# Patient Record
Sex: Male | Born: 1985 | Race: White | Hispanic: No | Marital: Single
Health system: Southern US, Community
[De-identification: ages and names within clinical notes are randomized; demographics above are authoritative.]

---

## 2007-03-22 ENCOUNTER — Encounter: Admission: RE | Admit: 2007-03-22 | Discharge: 2007-03-22 | Payer: Self-pay | Admitting: Gastroenterology

## 2007-04-04 ENCOUNTER — Inpatient Hospital Stay (HOSPITAL_COMMUNITY): Admission: EM | Admit: 2007-04-04 | Discharge: 2007-04-08 | Payer: Self-pay | Admitting: Emergency Medicine

## 2007-08-02 ENCOUNTER — Inpatient Hospital Stay (HOSPITAL_COMMUNITY): Admission: EM | Admit: 2007-08-02 | Discharge: 2007-08-04 | Payer: Self-pay | Admitting: Emergency Medicine

## 2007-08-28 ENCOUNTER — Encounter: Admission: RE | Admit: 2007-08-28 | Discharge: 2007-08-28 | Payer: Self-pay | Admitting: Gastroenterology

## 2009-01-14 ENCOUNTER — Encounter: Admission: RE | Admit: 2009-01-14 | Discharge: 2009-01-14 | Payer: Self-pay | Admitting: Gastroenterology

## 2009-11-17 ENCOUNTER — Encounter (HOSPITAL_COMMUNITY): Admission: RE | Admit: 2009-11-17 | Discharge: 2010-02-15 | Payer: Self-pay | Admitting: Gastroenterology

## 2010-02-23 ENCOUNTER — Encounter (HOSPITAL_COMMUNITY)
Admission: RE | Admit: 2010-02-23 | Discharge: 2010-05-11 | Payer: Self-pay | Source: Home / Self Care | Attending: Gastroenterology | Admitting: Gastroenterology

## 2010-06-09 ENCOUNTER — Encounter (HOSPITAL_COMMUNITY): Payer: BC Managed Care – PPO | Attending: Gastroenterology

## 2010-06-09 DIAGNOSIS — K5090 Crohn's disease, unspecified, without complications: Secondary | ICD-10-CM | POA: Insufficient documentation

## 2010-06-22 LAB — CBC
Platelets: 432 10*3/uL — ABNORMAL HIGH (ref 150–400)
RDW: 13.8 % (ref 11.5–15.5)
WBC: 10.1 10*3/uL (ref 4.0–10.5)

## 2010-06-22 LAB — COMPREHENSIVE METABOLIC PANEL
ALT: 13 U/L (ref 0–53)
Albumin: 2.8 g/dL — ABNORMAL LOW (ref 3.5–5.2)
CO2: 26 mEq/L (ref 19–32)
Calcium: 8.6 mg/dL (ref 8.4–10.5)
Chloride: 104 mEq/L (ref 96–112)
Potassium: 3.3 mEq/L — ABNORMAL LOW (ref 3.5–5.1)
Sodium: 138 mEq/L (ref 135–145)
Total Protein: 7 g/dL (ref 6.0–8.3)

## 2010-07-22 ENCOUNTER — Encounter (HOSPITAL_COMMUNITY): Payer: BLUE CROSS/BLUE SHIELD | Attending: Gastroenterology

## 2010-07-22 DIAGNOSIS — K5090 Crohn's disease, unspecified, without complications: Secondary | ICD-10-CM | POA: Insufficient documentation

## 2010-08-04 ENCOUNTER — Encounter (HOSPITAL_COMMUNITY): Payer: BC Managed Care – PPO

## 2010-08-24 NOTE — H&P (Signed)
NAME:  Brian Goodman, Brian Goodman NO.:  [id number]   MEDICAL RECORD NO.:  [id number]          PATIENT TYPE:  INP   LOCATION:  5125                         FACILITY:  [facility]   PHYSICIAN:  Graylin Shiver, M.D.   DATE OF BIRTH:  [birth year]   DATE OF ADMISSION:  04/04/2007  DATE OF DISCHARGE:                              HISTORY & PHYSICAL   HISTORY OF PRESENT ILLNESS:  This is a very pleasant 25 year old male  with a history of severe Crohn's.  He has had worsening abdominal pain  for several weeks and has been seen in our office recently.  As a matter-  of-fact, he had a colonoscopy on 03/26/2007.  He was started on 40 mg of  prednisone one week ago but has no improvement in his symptoms.  He  tells me that he has had watery diarrhea bowel movements every 30 to 60  minutes for the past 24 to 48 hours.  He has increased weakness and  fatigue and severe abdominal cramps.  He has seen no frank blood and is  unable to eat as it causes increase in his GI symptoms.   PAST MEDICAL HISTORY:  Significant for Crohn's on the colonoscopy done  by Dr. Wandalee Ferdinand last week.  He had predominantly right-sided colonic  Crohn's and also more disease scattered throughout the colon and in the  TI.  The patient has small bowel Crohn's disease as well.  He has no  other pertinent past medical history.  No history of surgeries.  Pathology done on a biopsy taken on 03/26/2007, showed chronic active  colitis consistent with Crohn's.   CURRENT MEDICATIONS:  He was started on 03/30/2007 on 40 mg of  prednisone per day.  He also takes daily Feosol.  He has tried Pentasa  in the past, took it for about two weeks and it caused nausea.   ALLERGIES:  He has no known drug allergies.   REVIEW OF SYSTEMS:  Significant for no fever, no shortness of breath, no  palpitations.   SOCIAL HISTORY:  Negative for tobacco or drugs.  The patient is part  owner of [employer].   PHYSICAL EXAMINATION:  GENERAL:  He  is alert and oriented and in no  apparent distress.  VITAL SIGNS:  His temperature is 97.2, pulse 95, respirations are 18,  blood pressure is 148/95.  CARDIOVASCULAR:  Regular rate and rhythm.  LUNGS: Clear to auscultation bilaterally.  ABDOMEN:  Soft, tender to palpation mostly in the lower quadrants.  He  has positive bowel sounds.   LABORATORY:  Labs show a sodium of 130, potassium 4.0, chloride 96,  bicarbonate 25, BUN 8, creatinine 0.82, glucose 137.  LFTs were elevated  with an AST of 70, ALT 114, alkaline phosphatase 179, total bilirubin  0.7.  Hemoglobin is 11.8, hematocrit 35.5, white count 13.7 and  platelets 704,000.  His serum albumin is currently 2.7.   ASSESSMENT:  Dr. Wandalee Ferdinand has seen and examined the patient, collected  a history and his impression is that this is a 25 year old male with a  Crohn's exacerbation as well as protein-calorie malnutrition and  anemia.  We will admit for close monitoring as well as gentle rehydration, start  him on IV steroids and pain control.      Stephani Police, PA    ______________________________  Graylin Shiver, M.D.    MLY/MEDQ  D:  04/04/2007  T:  04/04/2007  Job:  [id number]   cc:   Graylin Shiver, M.D.

## 2010-08-24 NOTE — Consult Note (Signed)
Brian Goodman, KRAS          ACCOUNT NO.:  [id number]   MEDICAL RECORD NO.:  [id number]           PATIENT TYPE:   LOCATION:                                 FACILITY:   PHYSICIAN:  Sharlet Salina T. Hoxworth, M.D.DATE OF BIRTH:  [birth year]   DATE OF CONSULTATION:  08/02/2007  DATE OF DISCHARGE:                                 CONSULTATION   CHIEF COMPLAINT:  Abdominal pain.   HISTORY OF PRESENT ILLNESS:  I was asked by Dr. Charlott Rakes to  evaluate Mr. Cangelosi.  He is a 25 year old Caucasian male with a  history of right-sided Crohn colitis.  His original diagnosis dates back  to about age 25.  He has not received any previous surgical therapy.  He  is generally followed by Dr. Evette Cristal.  The patient essentially has had one  severe flareup since diagnosis which occurred late last year.  He was  hospitalized in December 2008 for this flareup and treated with IV Solu-  Medrol with resolution.  He has been on a steroid taper in recent weeks  and has been feeling well without abdominal pain, diarrhea, or other GI  complaints.  Today, about 6 to 8 hours ago, he had fairly rapid onset of  right lower quadrant pain.  This has been severe and constant pain  localized only in the right lower quadrant.  He has not had any nausea,  vomiting, diarrhea, fever, or chills.  He presented to the emergency  room for evaluation.  The pain currently is better after pain  medication.  Of note, he had a colonoscopy on March 26, 2007, showing  a right-sided colonic Crohn, and he also has evidence of some terminal  ileal involvement.   PAST MEDICAL HISTORY:  Remarkable only as above.  He also had nasal  surgery as a child.   MEDICATIONS:  1. Prednisone 20 mg a day.  2. Imuran 75 mg a day.   ALLERGIES:  None.   SOCIAL HISTORY:  No cigarettes.  He drinks more than 2 drinks a night,  currently employed, single.   FAMILY HISTORY:  Noncontributory.   REVIEW OF SYSTEMS:  GENERAL:  No weight  change, fever, chills, or  malaise.  HEENT:  No vision, hearing, or swallowing problems.  RESPIRATORY:  No shortness breath, cough, or wheezing.  CARDIAC:  No  chest pain or palpitations.  ABDOMEN:  GI as above.  MUSCULOSKELETAL:  No joint pain or swelling.   PHYSICAL EXAMINATION:  VITAL SIGNS:  Temperature is 98, heart rate 96,  respirations 18, and blood pressure 144/90.  GENERAL:  A well-developed white male in no acute stress.  SKIN:  Warm and dry.  No rash or infection.  HEENT:  No palpable masses or thyromegaly.  Sclerae nonicteric.  LYMPH NODES:  No cervical, supraclavicular, or inguinal nodes palpable.  LUNGS:  Clear without wheezing or increased work of breathing.  CARDIAC:  Regular and mild tachycardia.  No murmurs.  ABDOMEN:  Nondistended.  There is well-localized right lower quadrant  tenderness with guarding.  No discernible masses or organomegaly.  EXTREMITIES:  No joint swelling or deformity.  NEUROLOGIC:  Alert and oriented.   LABORATORY DATA:  White count 18,000, hemoglobin 14, hematocrit 40.2,  and platelets 327.  Electrolytes are abnormal only for potassium of 3.  LFTs and lipase were normal.   CT scan of the abdomen and pelvis was personally reviewed.  This shows  pericecal inflammation most consistent with Crohn disease.  I cannot  rule out cecal diverticulitis or microperforation.  Also, I cannot  completely rule out appendicitis, although the appendix is visualized;  it is only mildly enlarged and there is not any specific periappendiceal  inflammation.   ASSESSMENT/PLAN:  Acute right lower quadrant abdominal pain.  This  patient with known right colonic and terminal ileal Crohn's disease.  He  had a flareup in December but had been doing well recently until this  very acute illness.  Clinically, I cannot completely rule out  appendicitis, but particularly based on the CT scan, this seems less  likely than a Crohn flareup or microperforation secondary to  Crohn.   The patient is being admitted by the GI service.  I agree with steroids  and antibiotics.  We will follow closely.  If he worsens or fails to  improve, we will require surgery which would require a partial colectomy  regardless of the etiology.      Lorne Skeens. Hoxworth, M.D.  Electronically Signed     BTH/MEDQ  D:  08/02/2007  T:  08/02/2007  Job:  [id number]

## 2010-08-24 NOTE — H&P (Signed)
NAMEMARVEN, VELEY          ACCOUNT NO.:  [id number]   MEDICAL RECORD NO.:  [id number]          PATIENT TYPE:  INP   LOCATION:  5118                         FACILITY:  [facility]   PHYSICIAN:  Shirley Friar, MDDATE OF BIRTH:  [birth year]   DATE OF ADMISSION:  08/01/2007  DATE OF DISCHARGE:                              HISTORY & PHYSICAL   ADMISSION DIAGNOSES:  1. Abdominal pain.  2. Crohn disease.   HISTORY OF PRESENT ILLNESS:  Mr. Bailly is a 25 year old white male  with Crohn disease diagnosed as a child who has ileocolonic disease.  He  had a recent hospitalization in December 2008 for Crohn's flare.  During  that hospitalization, he had an upper GI series and small bowel follow-  through, which showed irregular mucosa in the terminal ilium, but no  stricture noted.  He is on prednisone 20 mg p.o. daily and Imuran 75 mg  p.o. daily for his Crohn disease.  He was in his usual state of health  until 2 p.m. on yesterday afternoon (08/01/2007) when he had acute onset  of right lower quadrant sharp pain that quickly became 9/10 in severity.  The pain persisted for 8 hours when he called the on call doctor and he  was told to go to emergency room.  He denied any associated, nausea,  vomiting, fevers, chills, although he did have some diaphoresis at times  when he was having the pain.  He reports that during previous Crohn's  flares he would have associated diarrhea with pain, but during this  episode, he denied any change in his bowel habits.  On presentation to  the emergency room, he was afebrile.  He had a white blood count of  62952.  His white blood count in December was 15,000.  A noncontrast CT  scan, which showed pericecal inflammation and ileocecal mesentery.  The  appendix was enlarged but normal in appearance, and it was thought to be  separate location from the inflammatory process on CT.   PAST MEDICAL HISTORY:  Crohn disease, alcohol abuse.   MEDICINES:  1.  Prednisone 20 mg p.o. daily.  2. Imuran 75 mg p.o. daily.   ALLERGIES:  No known drug allergies.   FAMILY HISTORY:  Noncontributory.   SOCIAL HISTORY:  Alcohol abuse (drinks 2 glasses of rum per day and  occasional beer), marijuana use, denies smoking.   REVIEW OF SYSTEMS:  Negative except as stated above.   PHYSICAL EXAMINATION:  VITAL SIGNS:  Temperature 98.1, pulse 96, blood  pressure 144/90, and O2 sat is 99% on room air.  GENERAL:  Alert and in no acute distress.  CARDIOVASCULAR:  Tachycardic, regular rhythm.  CHEST:  Clear to auscultation bilaterally.  ABDOMEN:  Tender in the right lower quadrant to light palpation with  guarding, mild tenderness in the left lower quadrant without guarding,  otherwise nontender, no rebound, positive bowel sounds, and soft.   LABORATORY DATA:  White blood count 18,000, hemoglobin 14, platelet  count 327, potassium 3, glucose 128, and lipase 26.   IMPRESSION:  A 25 year old white male with known Crohn ileocolitis  presenting with severe right  lower quadrant abdominal pain.  After  reviewing his CT scan, I think his pain is probably related to  inflammation of his distal small bowel and a possible contained  perforation versus intense inflammation of the terminal ileum.  I do not  think he has appendicitis clinically.  His white blood count is likely  elevated due to his Crohn's and steroid treatment.  We will admit the  patient to the hospital for IV fluids, steroids, and empiric  antibiotics.  We will get a surgical consult.  I will place the patient  n.p.o. for now.  The patient may need to have a small bowel follow-  through during this hospitalization to further evaluate his terminal  ileal disease.      Shirley Friar, MD  Electronically Signed     VCS/MEDQ  D:  08/02/2007  T:  08/02/2007  Job:  [id number]   cc:   Graylin Shiver, M.D.  Lorne Skeens. Hoxworth, M.D.

## 2010-08-27 NOTE — Discharge Summary (Signed)
NAME:  Brian Goodman, Brian Goodman          ACCOUNT NO.:  [id number]   MEDICAL RECORD NO.:  [id number]          PATIENT TYPE:  INP   LOCATION:  5149                         FACILITY:  [facility]   PHYSICIAN:  James L. Randa Evens, M.D. DATE OF BIRTH:  1985/10/14   DATE OF ADMISSION:  08/02/2007  DATE OF DISCHARGE:  08/04/2007                               DISCHARGE SUMMARY   ADMITTING DIAGNOSES:  1. Crohn's disease.  2. Increased abdominal pain.   DISCHARGE DIAGNOSIS:  Crohn's disease.   CONSULT:  [facility] Dr. Glenna Fellows was consulted  in order to evaluate the patient for any perforation or possible  appendicitis.  No immediate surgical date was found but we appreciate  [facility] following along with [place] to care for the  patient.   PROCEDURES:  None.   RADIOLOGICAL EXAMINATION:  CT of his abdomen and pelvis on April 22  showed pericecal inflammation in the ileocecal mesentery.   HISTORY OF PRESENT ILLNESS:  This is a 25 year old male who has had  ileocolonic Crohn's disease since he was approximately 25 years old.  He  had a recent hospitalization December 2008 for another Crohn's flare.  He reported a sharp onset of right lower quadrant pain that started on  the afternoon of April 22, however, he had no change in bowel habits as  he normally does with other Crohn's flares.  He also denied any fever or  vomiting.  He was admitted to [facility] with a white count of  18,000, hemoglobin 14, hematocrit 40, platelet count 327.  Hepatitis B  surface antigen was drawn.  It was negative.  PPD was tested it was  negative.   HOSPITAL COURSE:  The patient was admitted, started on IV steroids.  He  very quickly improved.  He was seen by [facility].  He  also received Cipro and Flagyl.  His diet was advanced the day after  admission.  The following day his medications were changed to oral.  His  pain had substantially decreased, and he was  feeling much improved.  He  was able to tolerate a full regular diet.  He was discharged to home in  good condition on August 04, 2007, by Dr. Vida Rigger.  They have  instructed the patient not to eat any fried or spicy foods, uncooked  vegetables, popcorns, or nuts.  He also told him to follow up with Dr.  Evette Cristal in 10 days for an office visit.   DISCHARGE MEDICATIONS:  Included prednisone 30 mg two times a day for 1  week, if doing well he can decrease prednisone to 20 mg two times a day  until he sees Dr. Evette Cristal, Imuran 25 mg in the morning 50 mg in the  evening, Cipro 500 mg two times a day for 1 week, Flagyl 500 mg two  times a day for 1 week, Tylenol over the counter, no aspirin or  arthritis medications.  He was also given Darvocet for pain if not  relieved by Tylenol and was instructed not to drink any alcohol with  Flagyl.      Clerance Lav  L [place], PA    ______________________________  Llana Aliment Randa Evens, M.D.    MLY/MEDQ  D:  08/07/2007  T:  08/08/2007  Job:  [id number]

## 2010-08-27 NOTE — Discharge Summary (Signed)
Brian Goodman, Brian Goodman           ACCOUNT NO.:  [id number]   MEDICAL RECORD NO.:  [id number]           PATIENT TYPE:   LOCATION:                                 FACILITY:   PHYSICIAN:  Graylin Shiver, M.D.   DATE OF BIRTH:  1985-07-29   DATE OF ADMISSION:  04/04/2007  DATE OF DISCHARGE:  04/08/2007                               DISCHARGE SUMMARY   ADMIT DIAGNOSIS:  Inflammatory bowel disease, Crohn colitis.   DISCHARGE DIAGNOSES:  1. Inflammatory bowel disease.  2. Protein-calorie malnutrition.  3. Acute blood loss anemia.   PROCEDURES:  None.   CONSULTANTS:  None.   BRIEF HISTORY AND PHYSICAL:   HISTORY OF PRESENT ILLNESS:  This is a 25 year old male with a history  of severe Crohn colitis.  He has had worsening abdominal pain for  several weeks.  He was started on prednisone orally approximately 1 week  ago but has had no improvement in his symptoms.  On admission, he was  complaining of watery diarrhea every 30 to 60 minutes over the last 48  hours.  He had increased weakness, fatigue, and abdominal cramps but no  frank blood.  He was admitted, started on IV steroids as well as gentle  hydration.   Admit labs showed a white count of 13.7, hemoglobin 11.8, hematocrit  35.5, and platelets 704.  BMET was within normal limits other than a  glucose of 137.  LFTs showed an AST 70, ALT 114, alk phos 179, total  bilirubin 0.7, and serum albumin was 2.7.   During the course of his hospitalization, he felt better on IV steroids,  it was noted that he has had a colonoscopy the week prior to admission,  which showed predominantly right-sided colonic Crohn disease with more  scattered disease throughout the colon.  On March 21, 2008, prior to  this admission, the patient has had an upper GI series with small bowel  follow through that showed irregularity of the mucosa of the terminal  ileum indicating that Crohn disease may be in the TI as well.  Over the  course of this  hospitalization, we were able to slowly advance his diet,  so he was able to tolerate a full diet.  His stools decreased in number  and became more solid.  We were able to move  him from IV steroids to p.o. prednisone.  The patient was discharged  home in stable condition on medications that included prednisone 40 mg  daily and ferrous sulfate 325 mg daily.  He was told to call our office  with any signs of fever or severe abdominal pain or increase in stooling  again as well as any blood per rectum.      Stephani Police, PA    ______________________________  Graylin Shiver, M.D.    MLY/MEDQ  D:  07/17/2007  T:  07/18/2007  Job:  [id number]   cc:   Graylin Shiver, M.D.

## 2010-09-02 ENCOUNTER — Encounter (HOSPITAL_COMMUNITY): Payer: BC Managed Care – PPO | Attending: Gastroenterology

## 2010-09-02 DIAGNOSIS — K5090 Crohn's disease, unspecified, without complications: Secondary | ICD-10-CM | POA: Insufficient documentation

## 2010-10-14 ENCOUNTER — Encounter (HOSPITAL_COMMUNITY): Payer: BC Managed Care – PPO | Attending: Gastroenterology

## 2010-10-14 DIAGNOSIS — K5090 Crohn's disease, unspecified, without complications: Secondary | ICD-10-CM | POA: Insufficient documentation

## 2010-11-25 ENCOUNTER — Encounter (HOSPITAL_COMMUNITY): Payer: BC Managed Care – PPO | Attending: Gastroenterology

## 2010-11-25 DIAGNOSIS — K5090 Crohn's disease, unspecified, without complications: Secondary | ICD-10-CM | POA: Insufficient documentation

## 2011-01-04 LAB — COMPREHENSIVE METABOLIC PANEL
CO2: 26
Calcium: 8.9
Chloride: 102
GFR calc Af Amer: >60

## 2011-01-04 LAB — CBC
HCT: 38 — ABNORMAL LOW
HCT: 38.2 — ABNORMAL LOW
HCT: 40.2
Hemoglobin: 13
Hemoglobin: 13.3
Hemoglobin: 14
MCHC: 34
MCHC: 34
MCHC: 34.8
MCV: 81.8
MCV: 82.4
MCV: 82.8
Platelets: 287
Platelets: 309
Platelets: 327
RBC: 4.73
RBC: 4.92
RDW: 14.3
RDW: 14.4
RDW: 15
WBC: 16.8 — ABNORMAL HIGH
WBC: 18 — ABNORMAL HIGH
WBC: 19.1 — ABNORMAL HIGH
WBC: 20.9 — ABNORMAL HIGH

## 2011-01-04 LAB — DIFFERENTIAL
Basophils Absolute: 0.1
Basophils Relative: 0
Eosinophils Absolute: 0
Eosinophils Relative: 0
Lymphocytes Relative: 21
Lymphs Abs: 3.8
Monocytes Absolute: 1.2 — ABNORMAL HIGH
Monocytes Relative: 4
Monocytes Relative: 7
Neutro Abs: 12.9 — ABNORMAL HIGH
Neutrophils Relative %: 72

## 2011-01-04 LAB — BASIC METABOLIC PANEL
BUN: 17
CO2: 26
Calcium: 9.2
Calcium: 9.7
Chloride: 103
Creatinine, Ser: 0.82
Creatinine, Ser: 0.96
GFR calc Af Amer: >60
GFR calc non Af Amer: >60
Potassium: 4.1
Potassium: 4.1
Sodium: 138

## 2011-01-04 LAB — COMPREHENSIVE METABOLIC PANEL WITH GFR
ALT: 27
AST: 21
Albumin: 4
Alkaline Phosphatase: 64
BUN: 21
Creatinine, Ser: 1.12
GFR calc non Af Amer: >60
Glucose, Bld: 128 — ABNORMAL HIGH
Potassium: 3 — ABNORMAL LOW
Sodium: 139
Total Bilirubin: 0.4
Total Protein: 6.8

## 2011-01-04 LAB — LIPASE, BLOOD: Lipase: 26

## 2011-01-06 ENCOUNTER — Encounter (HOSPITAL_COMMUNITY): Payer: BC Managed Care – PPO | Attending: Gastroenterology

## 2011-01-06 DIAGNOSIS — K5090 Crohn's disease, unspecified, without complications: Secondary | ICD-10-CM | POA: Insufficient documentation

## 2011-01-14 LAB — COMPREHENSIVE METABOLIC PANEL
ALT: 114 — ABNORMAL HIGH
ALT: 78 — ABNORMAL HIGH
Alkaline Phosphatase: 127 — ABNORMAL HIGH
CO2: 25
CO2: 27
Creatinine, Ser: 0.69
GFR calc Af Amer: >60
GFR calc Af Amer: >60
GFR calc non Af Amer: >60
GFR calc non Af Amer: >60
Glucose, Bld: 106 — ABNORMAL HIGH
Potassium: 4
Potassium: 4
Total Protein: 6.1
Total Protein: 8

## 2011-01-14 LAB — CBC
HCT: 30.3 — ABNORMAL LOW
HCT: 31.4 — ABNORMAL LOW
HCT: 35.5 — ABNORMAL LOW
Hemoglobin: 10 — ABNORMAL LOW
Hemoglobin: 10 — ABNORMAL LOW
Hemoglobin: 11.8 — ABNORMAL LOW
MCHC: 33
MCHC: 33.1
MCV: 67 — ABNORMAL LOW
MCV: 67.7 — ABNORMAL LOW
Platelets: 553 — ABNORMAL HIGH
Platelets: 704 — ABNORMAL HIGH
RBC: 4.47
RBC: 4.56
RBC: 5.31
RDW: 15.2
RDW: 15.8 — ABNORMAL HIGH
RDW: 15.9 — ABNORMAL HIGH
WBC: 13.7 — ABNORMAL HIGH
WBC: 14.9 — ABNORMAL HIGH
WBC: 15.7 — ABNORMAL HIGH

## 2011-01-14 LAB — DIFFERENTIAL
Basophils Relative: 0
Eosinophils Absolute: 0 — ABNORMAL LOW
Lymphocytes Relative: 6 — ABNORMAL LOW
Monocytes Absolute: 0.3
Neutro Abs: 12.6 — ABNORMAL HIGH
Neutrophils Relative %: 92 — ABNORMAL HIGH

## 2011-01-14 LAB — GIARDIA/CRYPTOSPORIDIUM SCREEN(EIA)
Cryptosporidium Screen (EIA): NEGATIVE
Giardia Screen - EIA: NEGATIVE

## 2011-02-09 ENCOUNTER — Other Ambulatory Visit (HOSPITAL_COMMUNITY): Payer: Self-pay | Admitting: *Deleted

## 2011-02-18 ENCOUNTER — Encounter (HOSPITAL_COMMUNITY)
Admission: RE | Admit: 2011-02-18 | Discharge: 2011-02-18 | Disposition: A | Discharge status: Home / Self Care | Payer: BC Managed Care – PPO | Source: Ambulatory Visit | Attending: Gastroenterology | Admitting: Gastroenterology

## 2011-02-18 DIAGNOSIS — K5090 Crohn's disease, unspecified, without complications: Secondary | ICD-10-CM | POA: Insufficient documentation

## 2011-02-18 MED ORDER — SODIUM CHLORIDE 0.9 % IV SOLN
5.0000 mg/kg | Freq: Once | INTRAVENOUS | Status: AC
  Administered 2011-02-18: 300 mg via INTRAVENOUS
  Filled 2011-02-18: qty 30

## 2011-02-18 MED ORDER — SODIUM CHLORIDE 0.9 % IV SOLN: 5.0000 mg/kg | INTRAVENOUS | Status: DC

## 2011-03-24 ENCOUNTER — Other Ambulatory Visit: Payer: Self-pay | Admitting: Gastroenterology

## 2011-03-28 ENCOUNTER — Other Ambulatory Visit: Payer: BC Managed Care – PPO

## 2011-04-01 ENCOUNTER — Encounter (HOSPITAL_COMMUNITY)
Admission: RE | Admit: 2011-04-01 | Discharge: 2011-04-01 | Disposition: A | Discharge status: Home / Self Care | Payer: BC Managed Care – PPO | Source: Ambulatory Visit | Attending: Gastroenterology | Admitting: Gastroenterology

## 2011-04-01 DIAGNOSIS — K5090 Crohn's disease, unspecified, without complications: Secondary | ICD-10-CM | POA: Insufficient documentation

## 2011-04-01 MED ORDER — SODIUM CHLORIDE 0.9 % IV SOLN
5.0000 mg/kg | INTRAVENOUS | Status: DC
  Administered 2011-04-01: 300 mg via INTRAVENOUS
  Filled 2011-04-01: qty 30

## 2011-04-06 ENCOUNTER — Ambulatory Visit
Admission: RE | Admit: 2011-04-06 | Discharge: 2011-04-06 | Disposition: A | Discharge status: Home / Self Care | Payer: BC Managed Care – PPO | Source: Ambulatory Visit | Attending: Gastroenterology | Admitting: Gastroenterology

## 2011-05-10 ENCOUNTER — Other Ambulatory Visit (HOSPITAL_COMMUNITY): Payer: Self-pay | Admitting: *Deleted

## 2011-05-13 ENCOUNTER — Encounter (HOSPITAL_COMMUNITY)
Admission: RE | Admit: 2011-05-13 | Discharge: 2011-05-13 | Disposition: A | Discharge status: Home / Self Care | Payer: BC Managed Care – PPO | Source: Ambulatory Visit | Attending: Gastroenterology | Admitting: Gastroenterology

## 2011-05-13 DIAGNOSIS — K5090 Crohn's disease, unspecified, without complications: Secondary | ICD-10-CM | POA: Insufficient documentation

## 2011-05-13 MED ORDER — SODIUM CHLORIDE 0.9 % IV SOLN
5.0000 mg/kg | INTRAVENOUS | Status: AC
  Administered 2011-05-13: 300 mg via INTRAVENOUS
  Filled 2011-05-13: qty 30

## 2011-06-22 ENCOUNTER — Other Ambulatory Visit (HOSPITAL_COMMUNITY): Payer: Self-pay | Admitting: *Deleted

## 2011-06-24 ENCOUNTER — Encounter (HOSPITAL_COMMUNITY)
Admission: RE | Admit: 2011-06-24 | Discharge: 2011-06-24 | Disposition: A | Discharge status: Home / Self Care | Payer: BC Managed Care – PPO | Source: Ambulatory Visit | Attending: Gastroenterology | Admitting: Gastroenterology

## 2011-06-24 DIAGNOSIS — K5090 Crohn's disease, unspecified, without complications: Secondary | ICD-10-CM | POA: Insufficient documentation

## 2011-06-24 MED ORDER — SODIUM CHLORIDE 0.9 % IV SOLN
Freq: Once | INTRAVENOUS | Status: AC
  Administered 2011-06-24: 09:00:00 via INTRAVENOUS

## 2011-06-24 MED ORDER — SODIUM CHLORIDE 0.9 % IV SOLN
5.0000 mg/kg | INTRAVENOUS | Status: AC
  Administered 2011-06-24: 300 mg via INTRAVENOUS
  Filled 2011-06-24: qty 30

## 2011-07-03 IMAGING — CT CT ABDOMEN W/ CM
2 of 4 series · 10 of 36 positions shown, 17 images · IV contrast (READICAT/WATER & [ID] OMNI 300)
Comparison: 08/01/2007

CT ABDOMEN

CLINICAL DATA: Nausea, abdominal pain, Crohn's disease.

CT ABDOMEN AND PELVIS WITH CONTRAST
TECHNIQUE: Multidetector CT imaging of the abdomen and pelvis was
performed using the standard protocol following bolus
administration of intravenous contrast.
Contrast: 100 ml Rmnipaque-YTT

[Series 3: routine abdomen · axial · 0.65mm/px · z∈[-429,-74]mm · 9 of 89 slices shown, 15 images]
[im 9/89  soft-tissue]
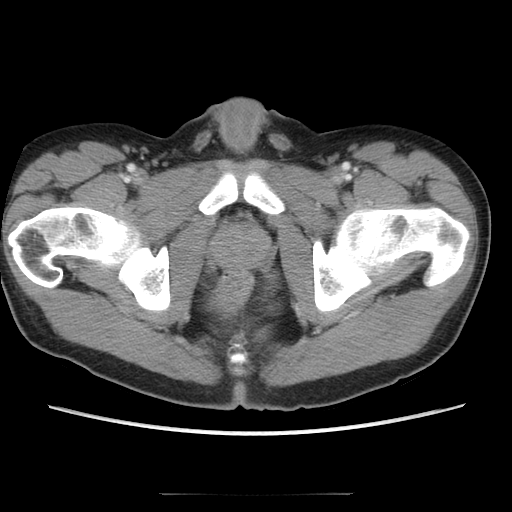
[im 9/89  bone]
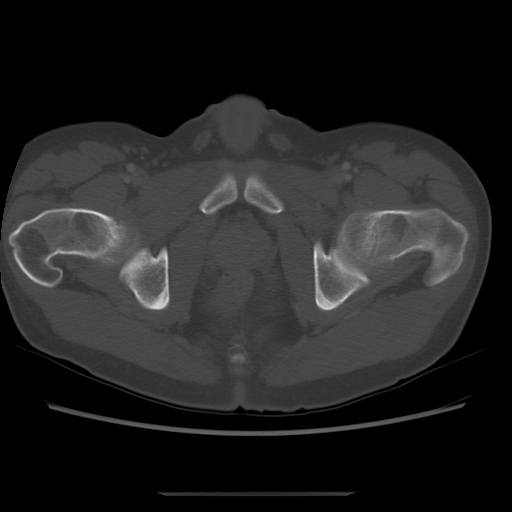
[im 18/89  soft-tissue]
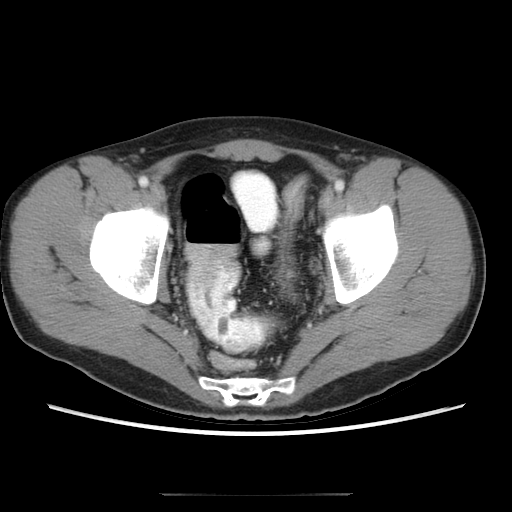
[im 27/89  soft-tissue]
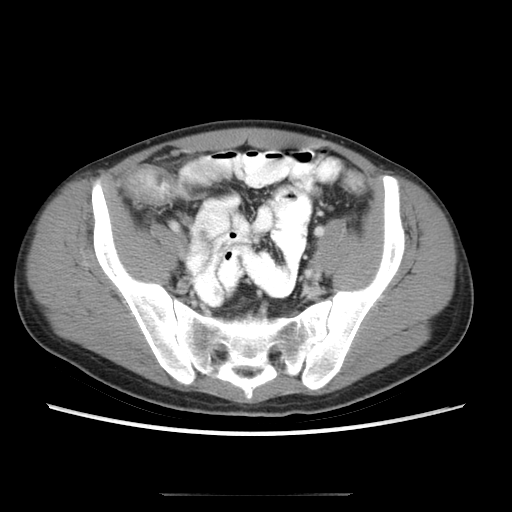
[im 36/89  soft-tissue]
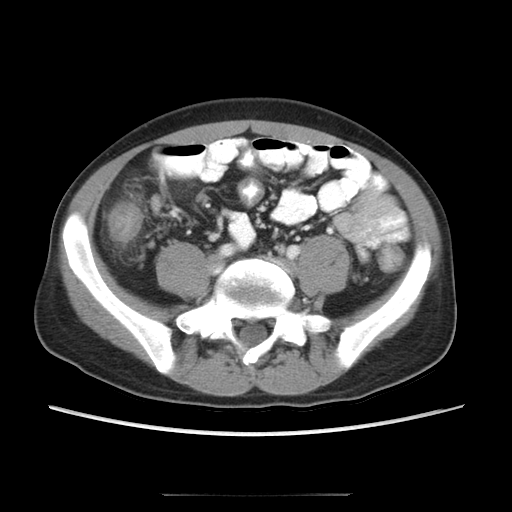
[im 45/89  soft-tissue]
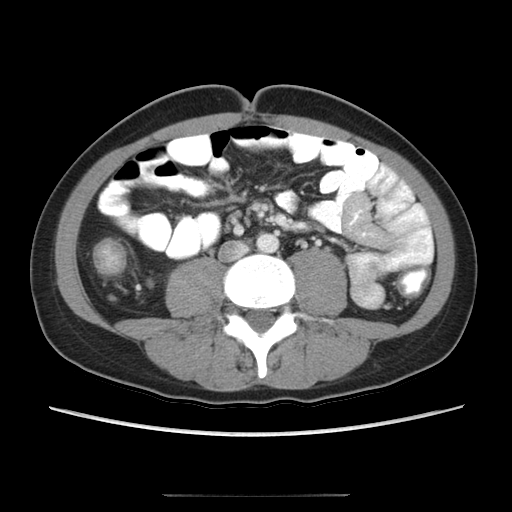
[im 53/89  soft-tissue]
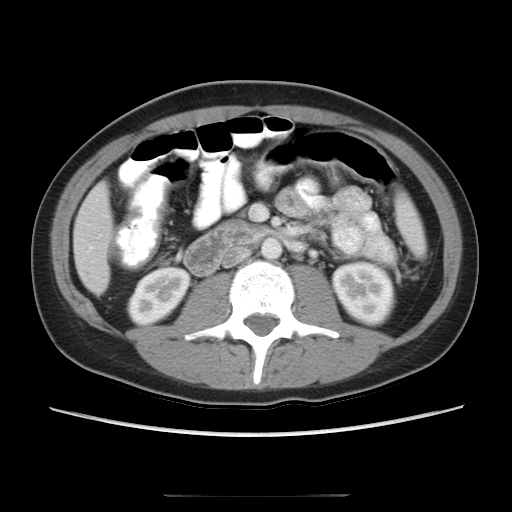
[im 53/89  lung]
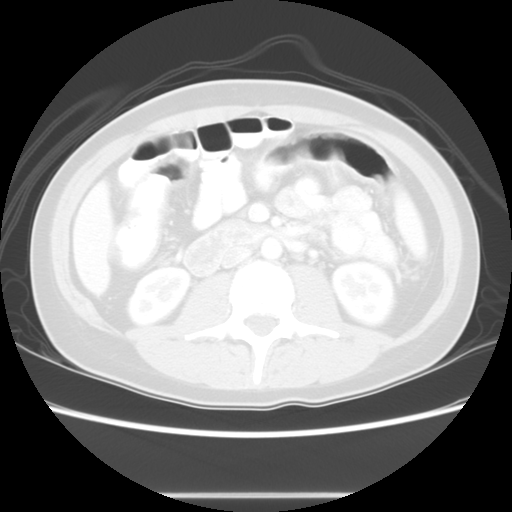
[im 62/89  soft-tissue]
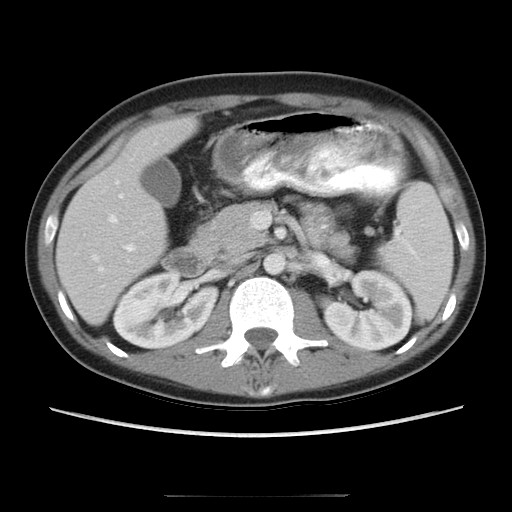
[im 62/89  lung]
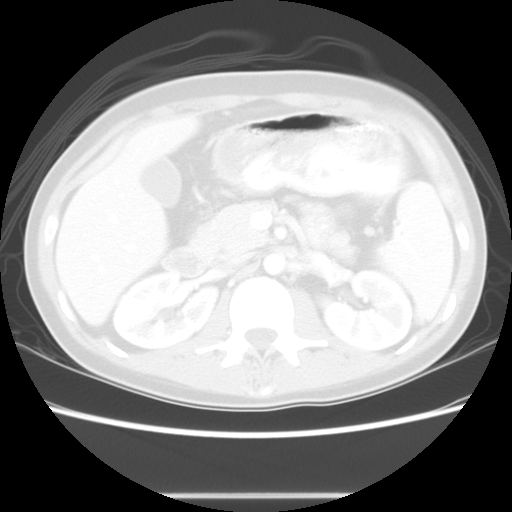
[im 71/89  soft-tissue]
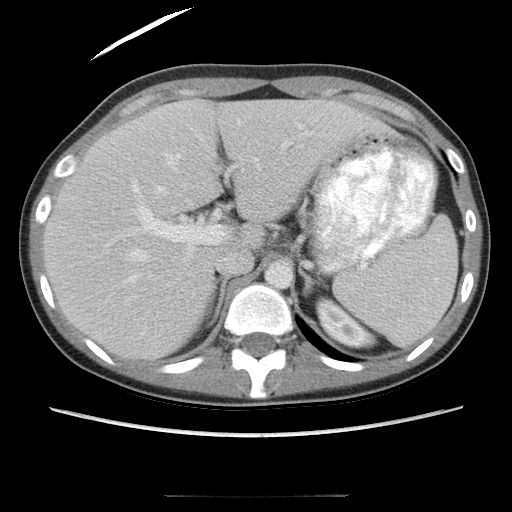
[im 71/89  lung]
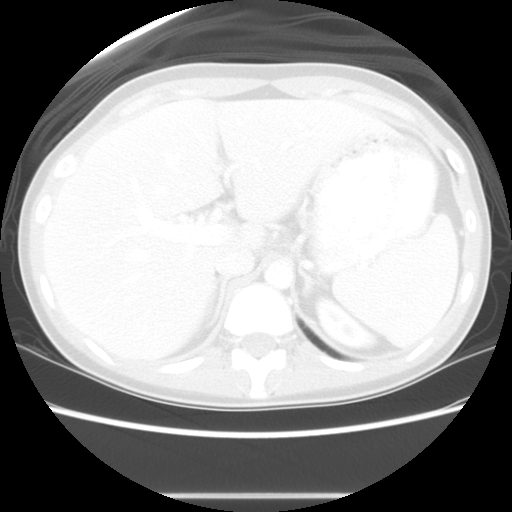
[im 80/89  soft-tissue]
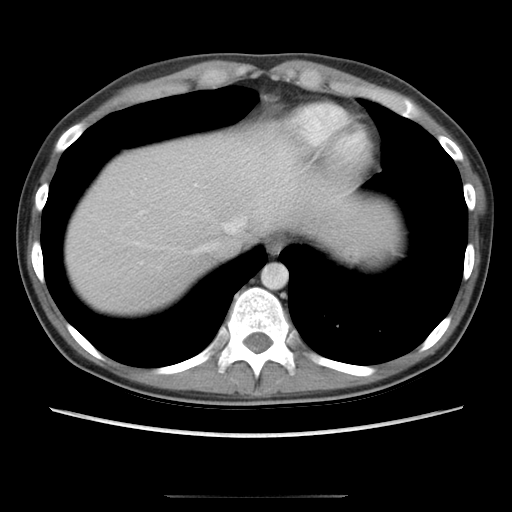
[im 80/89  lung]
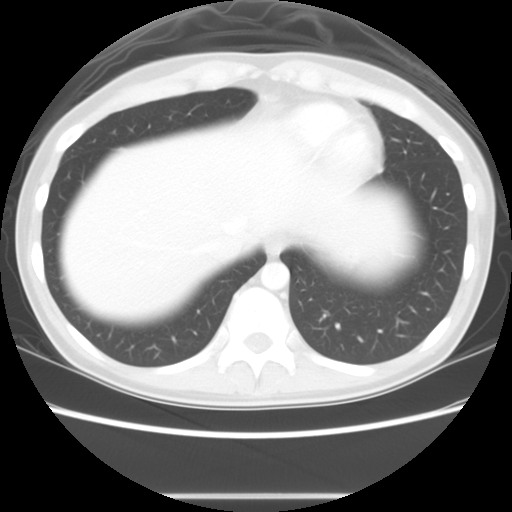
[im 80/89  bone]
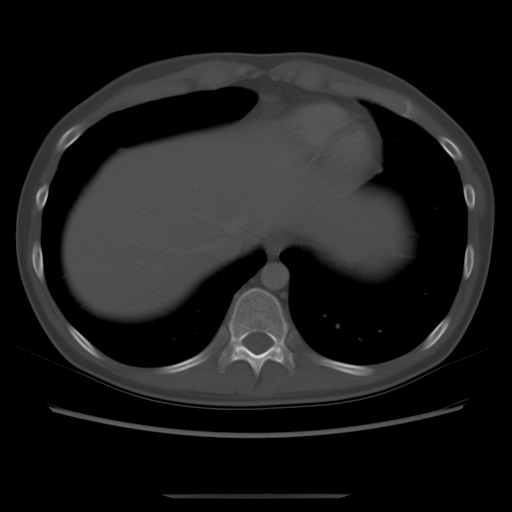

[Series 601: coronal body · coronal · 0.89mm/px · 1 of 113 slices shown, 2 images]
[im 38/113  soft-tissue]
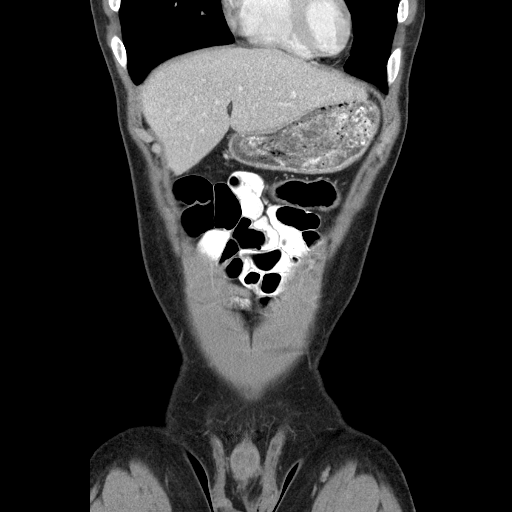
[im 38/113  bone]
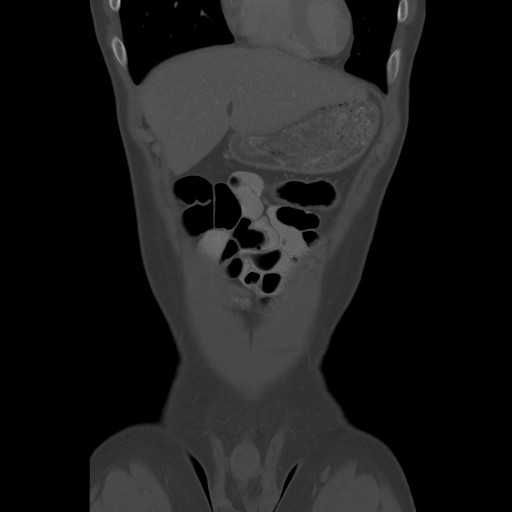

[10 of 36 positions shown; findings below may reference images not displayed]

FINDINGS: Lung bases are clear.  Heart size normal.  No
pericardial or pleural effusion.

Liver, gallbladder, adrenal glands, kidneys, spleen, pancreas and
proximal small bowel are unremarkable.  There are numerous
unenlarged abdominal peritoneal ligament, mesenteric and
retroperitoneal lymph nodes.  Largest mesenteric lymph node
measures 1 cm in short axis.  No free fluid.
IMPRESSION: No acute findings in the abdomen.

CT PELVIS
FINDINGS: There is thickening of the terminal ileum, cecum and
ascending colon, with surrounding inflammatory changes and reactive
lymph nodes.  The proximal appendix may be secondarily involved,
but is otherwise unremarkable.  The majority of the descending and
proximal sigmoid colon is collapsed, making evaluation difficult.
No free fluid.  No worrisome lytic or sclerotic lesions.
IMPRESSION: 1.  Active Crohn's disease involving the terminal ileum, cecum and
ascending colon, with associated reactive lymph nodes.
2.  Majority of the descending and proximal sigmoid colon are
collapsed, making evaluation for Crohn's involvement difficult.

## 2011-08-05 ENCOUNTER — Encounter (HOSPITAL_COMMUNITY)
Admission: RE | Admit: 2011-08-05 | Discharge: 2011-08-05 | Disposition: A | Discharge status: Home / Self Care | Payer: BC Managed Care – PPO | Source: Ambulatory Visit | Attending: Gastroenterology | Admitting: Gastroenterology

## 2011-08-05 DIAGNOSIS — K5090 Crohn's disease, unspecified, without complications: Secondary | ICD-10-CM | POA: Insufficient documentation

## 2011-08-05 MED ORDER — SODIUM CHLORIDE 0.9 % IV SOLN
5.0000 mg/kg | Freq: Once | INTRAVENOUS | Status: AC
  Administered 2011-08-05: 300 mg via INTRAVENOUS
  Filled 2011-08-05: qty 30

## 2011-09-16 ENCOUNTER — Encounter (HOSPITAL_COMMUNITY)
Admission: RE | Admit: 2011-09-16 | Discharge: 2011-09-16 | Disposition: A | Discharge status: Home / Self Care | Payer: BC Managed Care – PPO | Source: Ambulatory Visit | Attending: Gastroenterology | Admitting: Gastroenterology

## 2011-09-16 DIAGNOSIS — K5090 Crohn's disease, unspecified, without complications: Secondary | ICD-10-CM | POA: Insufficient documentation

## 2011-09-16 MED ORDER — SODIUM CHLORIDE 0.9 % IV SOLN
INTRAVENOUS | Status: DC
  Administered 2011-09-16: 09:00:00 via INTRAVENOUS

## 2011-09-16 MED ORDER — SODIUM CHLORIDE 0.9 % IV SOLN
5.0000 mg/kg | INTRAVENOUS | Status: DC
  Administered 2011-09-16: 300 mg via INTRAVENOUS
  Filled 2011-09-16: qty 30

## 2011-09-16 MED ORDER — SODIUM CHLORIDE 0.9 % IV SOLN
5.0000 mg/kg | Freq: Once | INTRAVENOUS | Status: DC
  Filled 2011-09-16: qty 30

## 2011-10-25 ENCOUNTER — Other Ambulatory Visit (HOSPITAL_COMMUNITY): Payer: Self-pay | Admitting: *Deleted

## 2011-10-28 ENCOUNTER — Encounter (HOSPITAL_COMMUNITY)
Admission: RE | Admit: 2011-10-28 | Discharge: 2011-10-28 | Disposition: A | Discharge status: Home / Self Care | Payer: BC Managed Care – PPO | Source: Ambulatory Visit | Attending: Gastroenterology | Admitting: Gastroenterology

## 2011-10-28 DIAGNOSIS — K5090 Crohn's disease, unspecified, without complications: Secondary | ICD-10-CM | POA: Insufficient documentation

## 2011-10-28 MED ORDER — SODIUM CHLORIDE 0.9 % IV SOLN
INTRAVENOUS | Status: DC
  Administered 2011-10-28: 09:00:00 via INTRAVENOUS

## 2011-10-28 MED ORDER — SODIUM CHLORIDE 0.9 % IV SOLN
5.0000 mg/kg | INTRAVENOUS | Status: DC
  Administered 2011-10-28: 300 mg via INTRAVENOUS
  Filled 2011-10-28: qty 30

## 2011-12-08 ENCOUNTER — Other Ambulatory Visit (HOSPITAL_COMMUNITY): Payer: Self-pay | Admitting: *Deleted

## 2011-12-09 ENCOUNTER — Encounter (HOSPITAL_COMMUNITY)
Admission: RE | Admit: 2011-12-09 | Discharge: 2011-12-09 | Disposition: A | Discharge status: Home / Self Care | Payer: BC Managed Care – PPO | Source: Ambulatory Visit | Attending: Gastroenterology | Admitting: Gastroenterology

## 2011-12-09 DIAGNOSIS — K5090 Crohn's disease, unspecified, without complications: Secondary | ICD-10-CM | POA: Insufficient documentation

## 2011-12-09 MED ORDER — SODIUM CHLORIDE 0.9 % IV SOLN
INTRAVENOUS | Status: DC
  Administered 2011-12-09: 09:00:00 via INTRAVENOUS

## 2011-12-09 MED ORDER — SODIUM CHLORIDE 0.9 % IV SOLN
5.0000 mg/kg | INTRAVENOUS | Status: DC
  Administered 2011-12-09: 300 mg via INTRAVENOUS
  Filled 2011-12-09: qty 30

## 2012-01-20 ENCOUNTER — Encounter (HOSPITAL_COMMUNITY)
Admission: RE | Admit: 2012-01-20 | Discharge: 2012-01-20 | Disposition: A | Discharge status: Home / Self Care | Payer: BC Managed Care – PPO | Source: Ambulatory Visit | Attending: Gastroenterology | Admitting: Gastroenterology

## 2012-01-20 DIAGNOSIS — K5090 Crohn's disease, unspecified, without complications: Secondary | ICD-10-CM | POA: Insufficient documentation

## 2012-01-20 MED ORDER — SODIUM CHLORIDE 0.9 % IV SOLN
5.0000 mg/kg | INTRAVENOUS | Status: DC
  Administered 2012-01-20: 300 mg via INTRAVENOUS
  Filled 2012-01-20: qty 30

## 2012-01-20 MED ORDER — SODIUM CHLORIDE 0.9 % IV SOLN
INTRAVENOUS | Status: DC
  Administered 2012-01-20: 10:00:00 via INTRAVENOUS

## 2012-03-01 ENCOUNTER — Other Ambulatory Visit (HOSPITAL_COMMUNITY): Payer: Self-pay | Admitting: *Deleted

## 2012-03-02 ENCOUNTER — Encounter (HOSPITAL_COMMUNITY)
Admission: RE | Admit: 2012-03-02 | Discharge: 2012-03-02 | Disposition: A | Discharge status: Home / Self Care | Payer: BC Managed Care – PPO | Source: Ambulatory Visit | Attending: Gastroenterology | Admitting: Gastroenterology

## 2012-03-02 DIAGNOSIS — K5090 Crohn's disease, unspecified, without complications: Secondary | ICD-10-CM | POA: Insufficient documentation

## 2012-03-02 MED ORDER — SODIUM CHLORIDE 0.9 % IV SOLN
5.0000 mg/kg | INTRAVENOUS | Status: DC
  Administered 2012-03-02: 300 mg via INTRAVENOUS
  Filled 2012-03-02: qty 30

## 2012-03-02 MED ORDER — SODIUM CHLORIDE 0.9 % IV SOLN
INTRAVENOUS | Status: DC
  Administered 2012-03-02: 09:00:00 via INTRAVENOUS

## 2012-04-13 ENCOUNTER — Encounter (HOSPITAL_COMMUNITY)
Admission: RE | Admit: 2012-04-13 | Discharge: 2012-04-13 | Disposition: A | Discharge status: Home / Self Care | Payer: BC Managed Care – PPO | Source: Ambulatory Visit | Attending: Gastroenterology | Admitting: Gastroenterology

## 2012-04-13 DIAGNOSIS — K5090 Crohn's disease, unspecified, without complications: Secondary | ICD-10-CM | POA: Insufficient documentation

## 2012-04-13 MED ORDER — SODIUM CHLORIDE 0.9 % IV SOLN
INTRAVENOUS | Status: DC
  Administered 2012-04-13: 09:00:00 via INTRAVENOUS

## 2012-04-13 MED ORDER — SODIUM CHLORIDE 0.9 % IV SOLN
5.0000 mg/kg | INTRAVENOUS | Status: DC
  Administered 2012-04-13: 300 mg via INTRAVENOUS
  Filled 2012-04-13: qty 30

## 2012-05-25 ENCOUNTER — Inpatient Hospital Stay (HOSPITAL_COMMUNITY): Admission: RE | Admit: 2012-05-25 | Payer: BC Managed Care – PPO | Source: Ambulatory Visit

## 2012-05-28 ENCOUNTER — Other Ambulatory Visit (HOSPITAL_COMMUNITY): Payer: Self-pay | Admitting: *Deleted

## 2012-05-28 ENCOUNTER — Encounter (HOSPITAL_COMMUNITY)
Admission: RE | Admit: 2012-05-28 | Discharge: 2012-05-28 | Disposition: A | Discharge status: Home / Self Care | Payer: BC Managed Care – PPO | Source: Ambulatory Visit | Attending: Gastroenterology | Admitting: Gastroenterology

## 2012-05-28 DIAGNOSIS — K5090 Crohn's disease, unspecified, without complications: Secondary | ICD-10-CM | POA: Insufficient documentation

## 2012-05-28 MED ORDER — SODIUM CHLORIDE 0.9 % IV SOLN
5.0000 mg/kg | INTRAVENOUS | Status: DC
  Administered 2012-05-28: 300 mg via INTRAVENOUS
  Filled 2012-05-28: qty 30

## 2012-05-28 MED ORDER — SODIUM CHLORIDE 0.9 % IV SOLN
INTRAVENOUS | Status: DC
  Administered 2012-05-28: 13:00:00 via INTRAVENOUS

## 2012-07-09 ENCOUNTER — Encounter (HOSPITAL_COMMUNITY)
Admission: RE | Admit: 2012-07-09 | Discharge: 2012-07-09 | Disposition: A | Discharge status: Home / Self Care | Payer: BC Managed Care – PPO | Source: Ambulatory Visit | Attending: Gastroenterology | Admitting: Gastroenterology

## 2012-07-09 DIAGNOSIS — K5090 Crohn's disease, unspecified, without complications: Secondary | ICD-10-CM | POA: Insufficient documentation

## 2012-07-09 MED ORDER — SODIUM CHLORIDE 0.9 % IV SOLN
INTRAVENOUS | Status: AC
  Administered 2012-07-09: 09:00:00 via INTRAVENOUS

## 2012-07-09 MED ORDER — SODIUM CHLORIDE 0.9 % IV SOLN
5.0000 mg/kg | INTRAVENOUS | Status: AC
  Administered 2012-07-09: 300 mg via INTRAVENOUS
  Filled 2012-07-09: qty 30

## 2012-08-23 ENCOUNTER — Other Ambulatory Visit (HOSPITAL_COMMUNITY): Payer: Self-pay | Admitting: *Deleted

## 2012-08-24 ENCOUNTER — Encounter (HOSPITAL_COMMUNITY)
Admission: RE | Admit: 2012-08-24 | Discharge: 2012-08-24 | Disposition: A | Discharge status: Home / Self Care | Payer: BC Managed Care – PPO | Source: Ambulatory Visit | Attending: Gastroenterology | Admitting: Gastroenterology

## 2012-08-24 DIAGNOSIS — K5090 Crohn's disease, unspecified, without complications: Secondary | ICD-10-CM | POA: Insufficient documentation

## 2012-08-24 MED ORDER — SODIUM CHLORIDE 0.9 % IV SOLN
5.0000 mg/kg | INTRAVENOUS | Status: DC
  Administered 2012-08-24: 300 mg via INTRAVENOUS
  Filled 2012-08-24: qty 30

## 2012-10-04 ENCOUNTER — Other Ambulatory Visit (HOSPITAL_COMMUNITY): Payer: Self-pay

## 2012-10-05 ENCOUNTER — Encounter (HOSPITAL_COMMUNITY)
Admission: RE | Admit: 2012-10-05 | Discharge: 2012-10-05 | Disposition: A | Discharge status: Home / Self Care | Payer: BC Managed Care – PPO | Source: Ambulatory Visit | Attending: Gastroenterology | Admitting: Gastroenterology

## 2012-10-05 DIAGNOSIS — K5090 Crohn's disease, unspecified, without complications: Secondary | ICD-10-CM | POA: Insufficient documentation

## 2012-10-05 MED ORDER — SODIUM CHLORIDE 0.9 % IV SOLN
Freq: Once | INTRAVENOUS | Status: AC
  Administered 2012-10-05: 250 mL via INTRAVENOUS

## 2012-10-05 MED ORDER — SODIUM CHLORIDE 0.9 % IV SOLN
5.0000 mg/kg | INTRAVENOUS | Status: DC
  Administered 2012-10-05: 300 mg via INTRAVENOUS
  Filled 2012-10-05: qty 30

## 2012-11-16 ENCOUNTER — Encounter (HOSPITAL_COMMUNITY)
Admission: RE | Admit: 2012-11-16 | Discharge: 2012-11-16 | Disposition: A | Discharge status: Home / Self Care | Payer: BC Managed Care – PPO | Source: Ambulatory Visit | Attending: Gastroenterology | Admitting: Gastroenterology

## 2012-11-16 DIAGNOSIS — K5090 Crohn's disease, unspecified, without complications: Secondary | ICD-10-CM | POA: Insufficient documentation

## 2012-11-16 MED ORDER — SODIUM CHLORIDE 0.9 % IV SOLN: 5.0000 mg/kg | INTRAVENOUS | Status: DC

## 2012-11-16 MED ORDER — SODIUM CHLORIDE 0.9 % IV SOLN: INTRAVENOUS | Status: DC

## 2012-11-16 MED ORDER — SODIUM CHLORIDE 0.9 % IV SOLN
INTRAVENOUS | Status: DC
  Administered 2012-11-16: 10:00:00 via INTRAVENOUS

## 2012-11-16 MED ORDER — SODIUM CHLORIDE 0.9 % IV SOLN
5.0000 mg/kg | INTRAVENOUS | Status: DC
  Administered 2012-11-16: 300 mg via INTRAVENOUS
  Filled 2012-11-16: qty 30

## 2012-12-28 ENCOUNTER — Encounter (HOSPITAL_COMMUNITY)
Admission: RE | Admit: 2012-12-28 | Discharge: 2012-12-28 | Disposition: A | Discharge status: Home / Self Care | Payer: BC Managed Care – PPO | Source: Ambulatory Visit | Attending: Gastroenterology | Admitting: Gastroenterology

## 2012-12-28 DIAGNOSIS — K5090 Crohn's disease, unspecified, without complications: Secondary | ICD-10-CM | POA: Insufficient documentation

## 2012-12-28 MED ORDER — SODIUM CHLORIDE 0.9 % IV SOLN
5.0000 mg/kg | INTRAVENOUS | Status: DC
  Administered 2012-12-28: 300 mg via INTRAVENOUS
  Filled 2012-12-28: qty 30

## 2012-12-28 MED ORDER — SODIUM CHLORIDE 0.9 % IV SOLN: INTRAVENOUS | Status: DC

## 2013-02-07 ENCOUNTER — Other Ambulatory Visit (HOSPITAL_COMMUNITY): Payer: Self-pay | Admitting: *Deleted

## 2013-02-08 ENCOUNTER — Encounter (HOSPITAL_COMMUNITY)
Admission: RE | Admit: 2013-02-08 | Discharge: 2013-02-08 | Disposition: A | Discharge status: Home / Self Care | Payer: BC Managed Care – PPO | Source: Ambulatory Visit | Attending: Gastroenterology | Admitting: Gastroenterology

## 2013-02-08 DIAGNOSIS — K5090 Crohn's disease, unspecified, without complications: Secondary | ICD-10-CM | POA: Insufficient documentation

## 2013-02-08 MED ORDER — SODIUM CHLORIDE 0.9 % IV SOLN
5.0000 mg/kg | INTRAVENOUS | Status: DC
  Administered 2013-02-08: 300 mg via INTRAVENOUS
  Filled 2013-02-08: qty 30

## 2013-02-08 MED ORDER — SODIUM CHLORIDE 0.9 % IV SOLN
INTRAVENOUS | Status: DC
  Administered 2013-02-08: 08:00:00 via INTRAVENOUS

## 2013-03-22 ENCOUNTER — Encounter (HOSPITAL_COMMUNITY)
Admission: RE | Admit: 2013-03-22 | Discharge: 2013-03-22 | Disposition: A | Discharge status: Home / Self Care | Payer: BC Managed Care – PPO | Source: Ambulatory Visit | Attending: Gastroenterology | Admitting: Gastroenterology

## 2013-03-22 DIAGNOSIS — K5090 Crohn's disease, unspecified, without complications: Secondary | ICD-10-CM | POA: Insufficient documentation

## 2013-03-22 MED ORDER — SODIUM CHLORIDE 0.9 % IV SOLN
INTRAVENOUS | Status: DC
  Administered 2013-03-22: 250 mL via INTRAVENOUS

## 2013-03-22 MED ORDER — SODIUM CHLORIDE 0.9 % IV SOLN
5.0000 mg/kg | Freq: Once | INTRAVENOUS | Status: AC
  Administered 2013-03-22: 300 mg via INTRAVENOUS
  Filled 2013-03-22: qty 30

## 2013-05-02 ENCOUNTER — Other Ambulatory Visit (HOSPITAL_COMMUNITY): Payer: Self-pay | Admitting: *Deleted

## 2013-05-03 ENCOUNTER — Encounter (HOSPITAL_COMMUNITY)
Admission: RE | Admit: 2013-05-03 | Discharge: 2013-05-03 | Disposition: A | Discharge status: Home / Self Care | Payer: BC Managed Care – PPO | Source: Ambulatory Visit | Attending: Gastroenterology | Admitting: Gastroenterology

## 2013-05-03 DIAGNOSIS — K5090 Crohn's disease, unspecified, without complications: Secondary | ICD-10-CM | POA: Insufficient documentation

## 2013-05-03 MED ORDER — SODIUM CHLORIDE 0.9 % IV SOLN
INTRAVENOUS | Status: DC
  Administered 2013-05-03: 09:00:00 via INTRAVENOUS

## 2013-05-03 MED ORDER — INFLIXIMAB 100 MG IV SOLR
5.0000 mg/kg | INTRAVENOUS | Status: DC
  Administered 2013-05-03: 300 mg via INTRAVENOUS
  Filled 2013-05-03: qty 30

## 2013-06-14 ENCOUNTER — Encounter (HOSPITAL_COMMUNITY)
Admission: RE | Admit: 2013-06-14 | Discharge: 2013-06-14 | Disposition: A | Discharge status: Home / Self Care | Payer: BC Managed Care – PPO | Source: Ambulatory Visit | Attending: Gastroenterology | Admitting: Gastroenterology

## 2013-06-14 DIAGNOSIS — K5090 Crohn's disease, unspecified, without complications: Secondary | ICD-10-CM | POA: Insufficient documentation

## 2013-06-14 MED ORDER — SODIUM CHLORIDE 0.9 % IV SOLN
INTRAVENOUS | Status: DC
  Administered 2013-06-14: 250 mL via INTRAVENOUS

## 2013-06-14 MED ORDER — SODIUM CHLORIDE 0.9 % IV SOLN
5.0000 mg/kg | INTRAVENOUS | Status: DC
  Administered 2013-06-14: 300 mg via INTRAVENOUS
  Filled 2013-06-14: qty 30

## 2013-07-26 ENCOUNTER — Encounter (HOSPITAL_COMMUNITY)
Admission: RE | Admit: 2013-07-26 | Discharge: 2013-07-26 | Disposition: A | Discharge status: Home / Self Care | Payer: BC Managed Care – PPO | Source: Ambulatory Visit | Attending: Gastroenterology | Admitting: Gastroenterology

## 2013-07-26 DIAGNOSIS — K5090 Crohn's disease, unspecified, without complications: Secondary | ICD-10-CM | POA: Insufficient documentation

## 2013-07-26 MED ORDER — SODIUM CHLORIDE 0.9 % IV SOLN
300.0000 mg | Freq: Once | INTRAVENOUS | Status: AC
Start: 2013-07-26 — End: 2013-07-26
  Administered 2013-07-26: 300 mg via INTRAVENOUS
  Filled 2013-07-26: qty 30

## 2013-07-26 MED ORDER — SODIUM CHLORIDE 0.9 % IV SOLN
INTRAVENOUS | Status: DC
  Administered 2013-07-26: 09:00:00 via INTRAVENOUS

## 2013-09-06 ENCOUNTER — Encounter (HOSPITAL_COMMUNITY)
Admission: RE | Admit: 2013-09-06 | Discharge: 2013-09-06 | Disposition: A | Discharge status: Home / Self Care | Payer: BC Managed Care – PPO | Source: Ambulatory Visit | Attending: Gastroenterology | Admitting: Gastroenterology

## 2013-09-06 DIAGNOSIS — K5090 Crohn's disease, unspecified, without complications: Secondary | ICD-10-CM | POA: Insufficient documentation

## 2013-09-06 MED ORDER — SODIUM CHLORIDE 0.9 % IV SOLN
INTRAVENOUS | Status: DC
Start: 2013-09-06 — End: 2013-09-07
  Administered 2013-09-06: 09:00:00 via INTRAVENOUS

## 2013-09-06 MED ORDER — SODIUM CHLORIDE 0.9 % IV SOLN
5.0000 mg/kg | INTRAVENOUS | Status: DC
  Administered 2013-09-06: 300 mg via INTRAVENOUS
  Filled 2013-09-06: qty 30

## 2013-09-22 IMAGING — US US ABDOMEN COMPLETE
1 series · 14 of 25 positions shown · non-contrast
Comparison: 01/14/2009 CT

CLINICAL DATA: Elevated LFTs.

COMPLETE ABDOMINAL ULTRASOUND

[Series 1: us abdomen complete · 0.27mm/px · 14 of 88 slices shown]
[im 1/88]
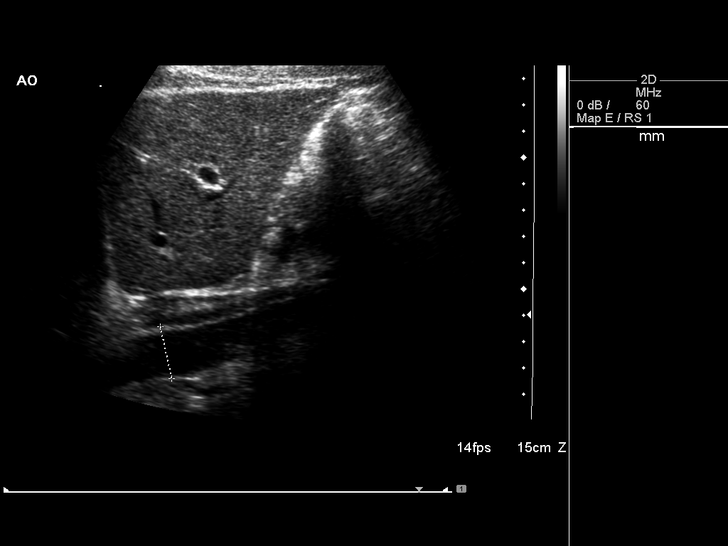
[im 8/88]
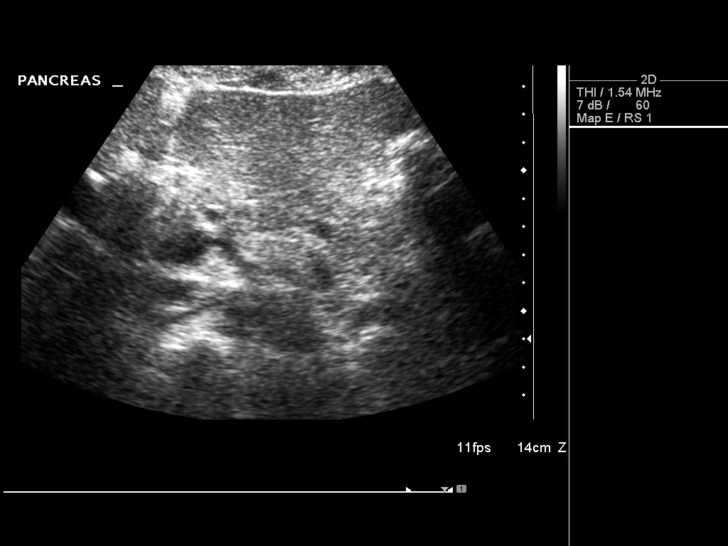
[im 15/88]
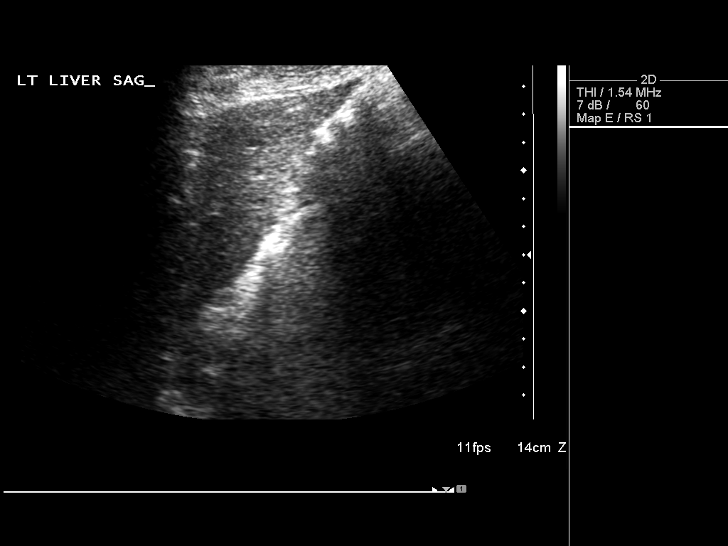
[im 22/88]
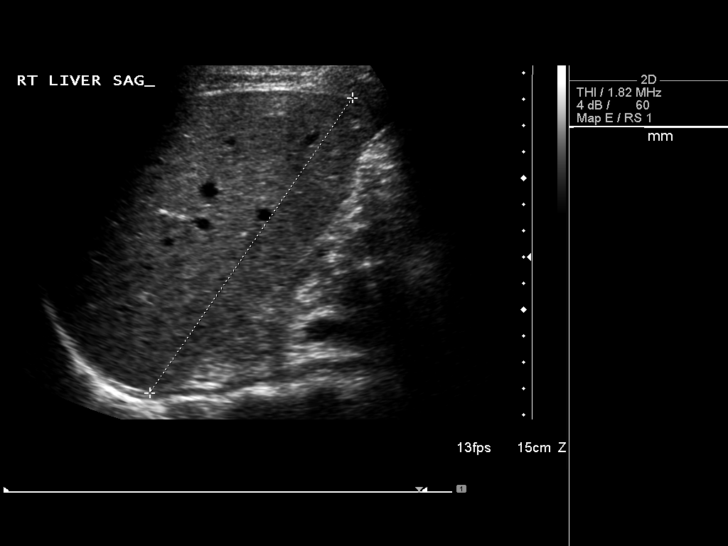
[im 30/88]
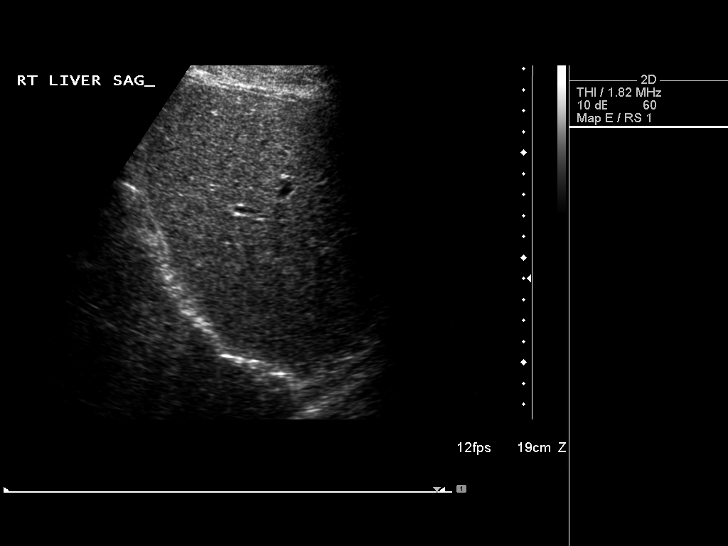
[im 33/88]
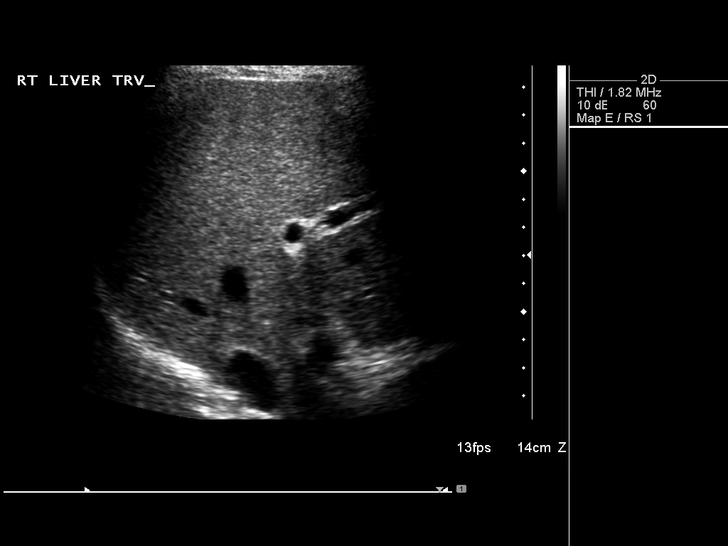
[im 40/88]
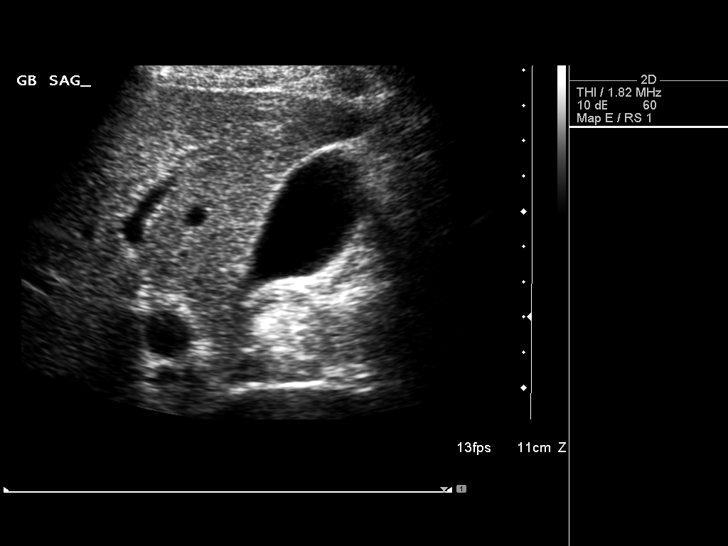
[im 48/88]
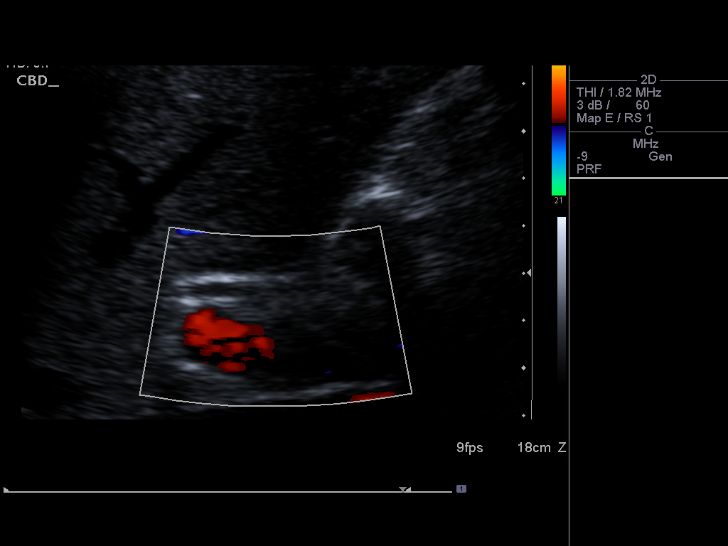
[im 55/88]
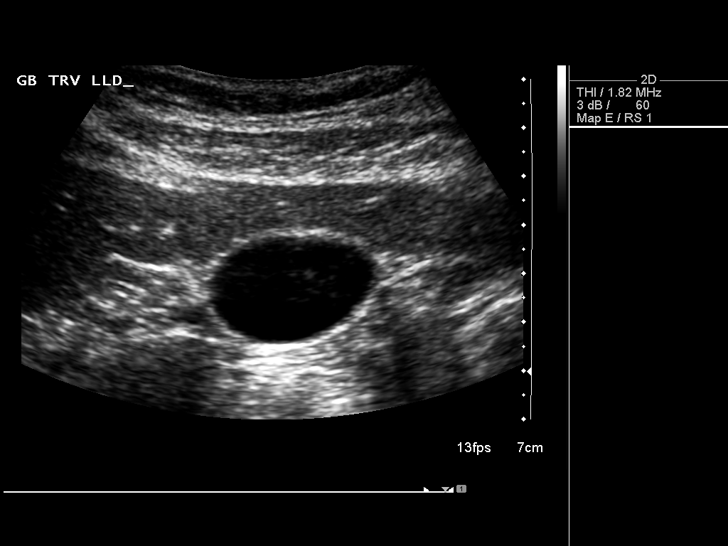
[im 59/88]
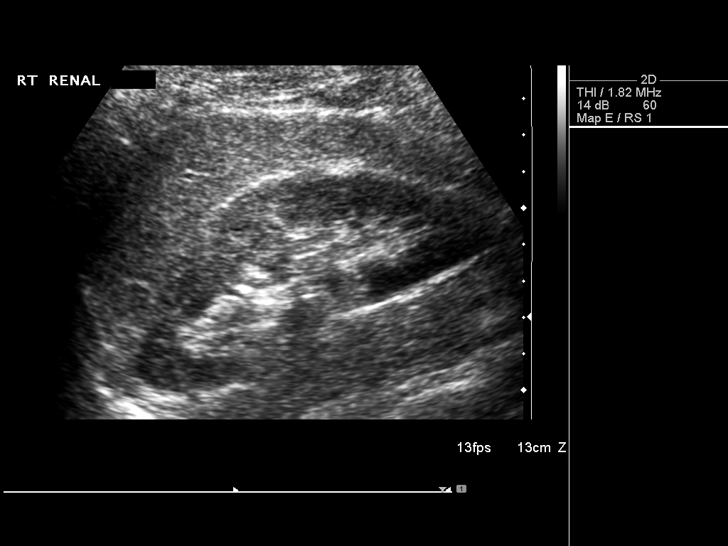
[im 66/88]
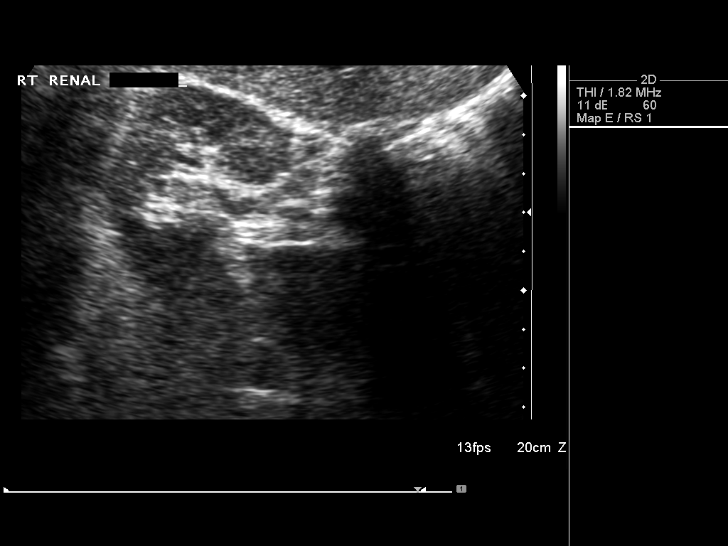
[im 73/88]
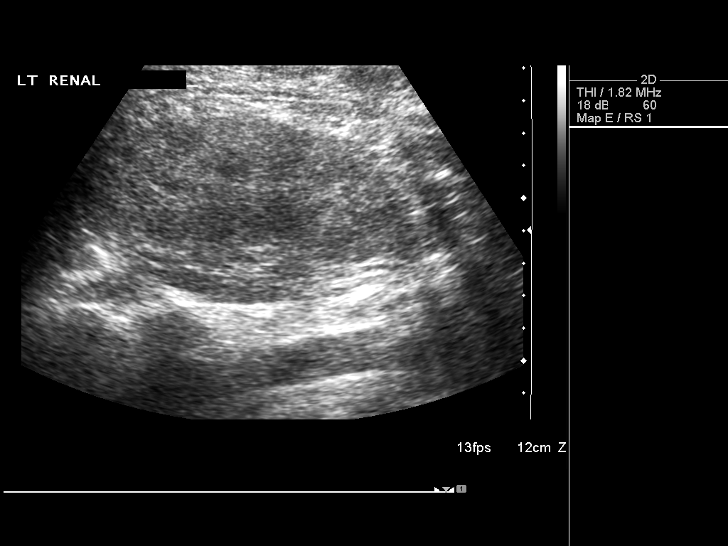
[im 80/88]
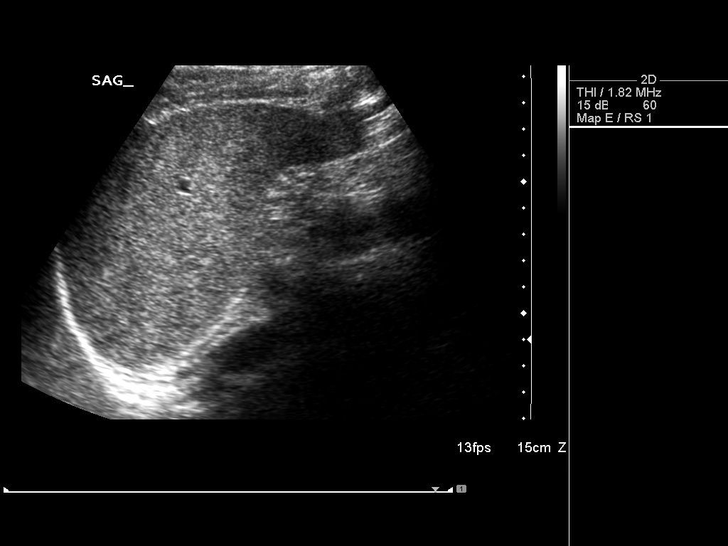
[im 88/88]
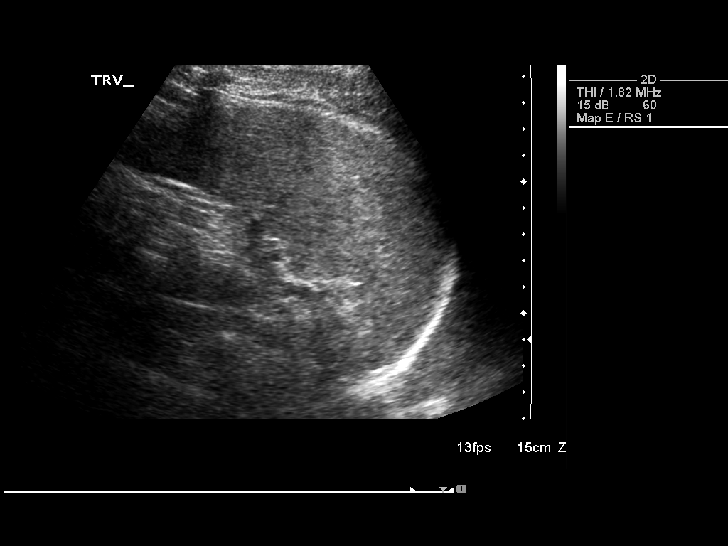

[14 of 25 positions shown; findings below may reference images not displayed]

FINDINGS: Gallbladder:  No gallstones, gallbladder wall thickening, or
pericholecystic fluid.

Common bile duct:  Measures 3 mm in diameter, within normal limits.

Liver:  No focal lesion identified.  Within normal limits in
parenchymal echogenicity.

IVC:  Appears normal.

Pancreas:  No focal abnormality seen.

Spleen:  Upper normal limits, measuring up to 12.5 cm in diameter.
No focal abnormality.

Right Kidney:  Normal sonographic appearance.  No hydronephrosis.
Measures 10.8 cm.

Left Kidney:  Normal sonographic appearance, no hydronephrosis.
Measures 11.7 cm.

Abdominal aorta:  No aneurysm identified.
IMPRESSION: Negative abdominal ultrasound.

## 2013-10-17 ENCOUNTER — Other Ambulatory Visit (HOSPITAL_COMMUNITY): Payer: Self-pay | Admitting: *Deleted

## 2013-10-18 ENCOUNTER — Encounter (HOSPITAL_COMMUNITY)
Admission: RE | Admit: 2013-10-18 | Discharge: 2013-10-18 | Disposition: A | Discharge status: Home / Self Care | Payer: BC Managed Care – PPO | Source: Ambulatory Visit | Attending: Gastroenterology | Admitting: Gastroenterology

## 2013-10-18 DIAGNOSIS — K5090 Crohn's disease, unspecified, without complications: Secondary | ICD-10-CM | POA: Insufficient documentation

## 2013-10-18 MED ORDER — SODIUM CHLORIDE 0.9 % IV SOLN
300.0000 mg | INTRAVENOUS | Status: DC
  Administered 2013-10-18: 300 mg via INTRAVENOUS
  Filled 2013-10-18: qty 30

## 2013-10-18 MED ORDER — SODIUM CHLORIDE 0.9 % IV SOLN
INTRAVENOUS | Status: DC
  Administered 2013-10-18: 08:00:00 via INTRAVENOUS

## 2013-11-28 ENCOUNTER — Other Ambulatory Visit (HOSPITAL_COMMUNITY): Payer: Self-pay

## 2013-11-29 ENCOUNTER — Encounter (HOSPITAL_COMMUNITY)
Admission: RE | Admit: 2013-11-29 | Discharge: 2013-11-29 | Disposition: A | Discharge status: Home / Self Care | Payer: BC Managed Care – PPO | Source: Ambulatory Visit | Attending: Gastroenterology | Admitting: Gastroenterology

## 2013-11-29 DIAGNOSIS — K5090 Crohn's disease, unspecified, without complications: Secondary | ICD-10-CM | POA: Insufficient documentation

## 2013-11-29 MED ORDER — SODIUM CHLORIDE 0.9 % IV SOLN
5.0000 mg/kg | Freq: Once | INTRAVENOUS | Status: AC
  Administered 2013-11-29: 300 mg via INTRAVENOUS
  Filled 2013-11-29: qty 30

## 2013-11-29 MED ORDER — SODIUM CHLORIDE 0.9 % IV SOLN
INTRAVENOUS | Status: DC
  Administered 2013-11-29: 250 mL via INTRAVENOUS

## 2014-01-09 ENCOUNTER — Other Ambulatory Visit (HOSPITAL_COMMUNITY): Payer: Self-pay | Admitting: *Deleted

## 2014-01-10 ENCOUNTER — Encounter (HOSPITAL_COMMUNITY)
Admission: RE | Admit: 2014-01-10 | Discharge: 2014-01-10 | Disposition: A | Discharge status: Home / Self Care | Payer: BC Managed Care – PPO | Source: Ambulatory Visit | Attending: Gastroenterology | Admitting: Gastroenterology

## 2014-01-10 DIAGNOSIS — K5090 Crohn's disease, unspecified, without complications: Secondary | ICD-10-CM | POA: Insufficient documentation

## 2014-01-10 MED ORDER — SODIUM CHLORIDE 0.9 % IV SOLN
INTRAVENOUS | Status: DC
  Administered 2014-01-10: 250 mL via INTRAVENOUS

## 2014-01-10 MED ORDER — SODIUM CHLORIDE 0.9 % IV SOLN
5.0000 mg/kg | INTRAVENOUS | Status: DC
  Administered 2014-01-10: 300 mg via INTRAVENOUS
  Filled 2014-01-10: qty 30

## 2014-02-21 ENCOUNTER — Encounter (HOSPITAL_COMMUNITY)
Admission: RE | Admit: 2014-02-21 | Discharge: 2014-02-21 | Disposition: A | Discharge status: Home / Self Care | Payer: BC Managed Care – PPO | Source: Ambulatory Visit | Attending: Gastroenterology | Admitting: Gastroenterology

## 2014-02-21 DIAGNOSIS — K5090 Crohn's disease, unspecified, without complications: Secondary | ICD-10-CM | POA: Insufficient documentation

## 2014-02-21 MED ORDER — SODIUM CHLORIDE 0.9 % IV SOLN
INTRAVENOUS | Status: DC
  Administered 2014-02-21: 08:00:00 via INTRAVENOUS

## 2014-02-21 MED ORDER — SODIUM CHLORIDE 0.9 % IV SOLN
5.0000 mg/kg | INTRAVENOUS | Status: DC
  Administered 2014-02-21: 300 mg via INTRAVENOUS
  Filled 2014-02-21: qty 30

## 2014-04-07 ENCOUNTER — Encounter (HOSPITAL_COMMUNITY)
Admission: RE | Admit: 2014-04-07 | Discharge: 2014-04-07 | Disposition: A | Discharge status: Home / Self Care | Payer: BC Managed Care – PPO | Source: Ambulatory Visit | Attending: Gastroenterology | Admitting: Gastroenterology

## 2014-04-07 DIAGNOSIS — K5090 Crohn's disease, unspecified, without complications: Secondary | ICD-10-CM | POA: Diagnosis not present

## 2014-04-07 MED ORDER — SODIUM CHLORIDE 0.9 % IV SOLN
5.0000 mg/kg | INTRAVENOUS | Status: DC
  Administered 2014-04-07: 300 mg via INTRAVENOUS
  Filled 2014-04-07: qty 30

## 2014-04-07 MED ORDER — SODIUM CHLORIDE 0.9 % IV SOLN
INTRAVENOUS | Status: DC
  Administered 2014-04-07: 09:00:00 via INTRAVENOUS

## 2014-05-19 ENCOUNTER — Encounter (HOSPITAL_COMMUNITY)
Admission: RE | Admit: 2014-05-19 | Discharge: 2014-05-19 | Disposition: A | Discharge status: Home / Self Care | Payer: BLUE CROSS/BLUE SHIELD | Source: Ambulatory Visit | Attending: Gastroenterology | Admitting: Gastroenterology

## 2014-05-19 DIAGNOSIS — K5090 Crohn's disease, unspecified, without complications: Secondary | ICD-10-CM | POA: Diagnosis present

## 2014-05-19 MED ORDER — SODIUM CHLORIDE 0.9 % IV SOLN
5.0000 mg/kg | INTRAVENOUS | Status: AC
  Administered 2014-05-19: 300 mg via INTRAVENOUS
  Filled 2014-05-19: qty 30

## 2014-05-19 MED ORDER — SODIUM CHLORIDE 0.9 % IV SOLN: INTRAVENOUS | Status: DC

## 2014-06-27 ENCOUNTER — Other Ambulatory Visit (HOSPITAL_COMMUNITY): Payer: Self-pay | Admitting: *Deleted

## 2014-06-30 ENCOUNTER — Encounter (HOSPITAL_COMMUNITY): Payer: Self-pay

## 2014-07-14 ENCOUNTER — Encounter (HOSPITAL_COMMUNITY)
Admission: RE | Admit: 2014-07-14 | Discharge: 2014-07-14 | Disposition: A | Discharge status: Home / Self Care | Payer: BLUE CROSS/BLUE SHIELD | Source: Ambulatory Visit | Attending: Gastroenterology | Admitting: Gastroenterology

## 2014-07-14 DIAGNOSIS — K5090 Crohn's disease, unspecified, without complications: Secondary | ICD-10-CM | POA: Insufficient documentation

## 2014-07-14 MED ORDER — SODIUM CHLORIDE 0.9 % IV SOLN
INTRAVENOUS | Status: DC
  Administered 2014-07-14: 09:00:00 via INTRAVENOUS

## 2014-07-14 MED ORDER — SODIUM CHLORIDE 0.9 % IV SOLN
5.0000 mg/kg | INTRAVENOUS | Status: DC
  Administered 2014-07-14: 300 mg via INTRAVENOUS
  Filled 2014-07-14: qty 30

## 2014-09-05 ENCOUNTER — Other Ambulatory Visit (HOSPITAL_COMMUNITY): Payer: Self-pay | Admitting: *Deleted

## 2014-09-09 ENCOUNTER — Encounter (HOSPITAL_COMMUNITY)
Admission: RE | Admit: 2014-09-09 | Discharge: 2014-09-09 | Disposition: A | Discharge status: Home / Self Care | Payer: BLUE CROSS/BLUE SHIELD | Source: Ambulatory Visit | Attending: Gastroenterology | Admitting: Gastroenterology

## 2014-09-09 DIAGNOSIS — K5090 Crohn's disease, unspecified, without complications: Secondary | ICD-10-CM | POA: Insufficient documentation

## 2014-09-09 MED ORDER — SODIUM CHLORIDE 0.9 % IV SOLN
300.0000 mg | INTRAVENOUS | Status: DC
  Administered 2014-09-09: 300 mg via INTRAVENOUS
  Filled 2014-09-09: qty 30

## 2014-09-09 MED ORDER — SODIUM CHLORIDE 0.9 % IV SOLN
INTRAVENOUS | Status: DC
  Administered 2014-09-09: 250 mL via INTRAVENOUS

## 2014-11-11 ENCOUNTER — Other Ambulatory Visit (HOSPITAL_COMMUNITY): Payer: Self-pay | Admitting: *Deleted

## 2014-11-12 ENCOUNTER — Encounter (HOSPITAL_COMMUNITY)
Admission: RE | Admit: 2014-11-12 | Discharge: 2014-11-12 | Disposition: A | Discharge status: Home / Self Care | Payer: BLUE CROSS/BLUE SHIELD | Source: Ambulatory Visit | Attending: Gastroenterology | Admitting: Gastroenterology

## 2014-11-12 DIAGNOSIS — K5090 Crohn's disease, unspecified, without complications: Secondary | ICD-10-CM | POA: Insufficient documentation

## 2014-11-12 MED ORDER — SODIUM CHLORIDE 0.9 % IV SOLN
INTRAVENOUS | Status: DC
  Administered 2014-11-12: 08:00:00 via INTRAVENOUS

## 2014-11-12 MED ORDER — SODIUM CHLORIDE 0.9 % IV SOLN
5.0000 mg/kg | INTRAVENOUS | Status: DC
  Administered 2014-11-12: 300 mg via INTRAVENOUS
  Filled 2014-11-12: qty 30

## 2015-01-21 ENCOUNTER — Encounter (HOSPITAL_COMMUNITY)
Admission: RE | Admit: 2015-01-21 | Discharge: 2015-01-21 | Disposition: A | Discharge status: Home / Self Care | Payer: BLUE CROSS/BLUE SHIELD | Source: Ambulatory Visit | Attending: Gastroenterology | Admitting: Gastroenterology

## 2015-01-21 DIAGNOSIS — K5090 Crohn's disease, unspecified, without complications: Secondary | ICD-10-CM | POA: Insufficient documentation

## 2015-01-21 MED ORDER — SODIUM CHLORIDE 0.9 % IV SOLN
5.0000 mg/kg | INTRAVENOUS | Status: AC
  Administered 2015-01-21: 400 mg via INTRAVENOUS
  Filled 2015-01-21: qty 40

## 2015-01-21 MED ORDER — SODIUM CHLORIDE 0.9 % IV SOLN: INTRAVENOUS | Status: DC

## 2015-04-08 ENCOUNTER — Encounter (HOSPITAL_COMMUNITY)
Admission: RE | Admit: 2015-04-08 | Discharge: 2015-04-08 | Disposition: A | Discharge status: Home / Self Care | Payer: BLUE CROSS/BLUE SHIELD | Source: Ambulatory Visit | Attending: Gastroenterology | Admitting: Gastroenterology

## 2015-04-08 DIAGNOSIS — K5090 Crohn's disease, unspecified, without complications: Secondary | ICD-10-CM | POA: Insufficient documentation

## 2015-04-08 MED ORDER — SODIUM CHLORIDE 0.9 % IV SOLN
5.0000 mg/kg | Freq: Once | INTRAVENOUS | Status: AC
  Administered 2015-04-08: 400 mg via INTRAVENOUS
  Filled 2015-04-08: qty 40

## 2015-04-08 MED ORDER — SODIUM CHLORIDE 0.9 % IV SOLN
INTRAVENOUS | Status: DC
  Administered 2015-04-08: 09:00:00 via INTRAVENOUS

## 2015-06-17 ENCOUNTER — Ambulatory Visit (HOSPITAL_COMMUNITY)
Admission: RE | Admit: 2015-06-17 | Discharge: 2015-06-17 | Disposition: A | Discharge status: Home / Self Care | Payer: BLUE CROSS/BLUE SHIELD | Source: Ambulatory Visit | Attending: Gastroenterology | Admitting: Gastroenterology

## 2015-06-17 DIAGNOSIS — K5090 Crohn's disease, unspecified, without complications: Secondary | ICD-10-CM | POA: Insufficient documentation

## 2015-06-17 MED ORDER — SODIUM CHLORIDE 0.9 % IV SOLN
INTRAVENOUS | Status: DC
  Administered 2015-06-17: 09:00:00 via INTRAVENOUS

## 2015-06-17 MED ORDER — SODIUM CHLORIDE 0.9 % IV SOLN
5.0000 mg/kg | Freq: Once | INTRAVENOUS | Status: AC
  Administered 2015-06-17: 400 mg via INTRAVENOUS
  Filled 2015-06-17: qty 40

## 2015-08-06 ENCOUNTER — Other Ambulatory Visit (HOSPITAL_COMMUNITY): Payer: Self-pay | Admitting: *Deleted

## 2015-08-07 ENCOUNTER — Ambulatory Visit (HOSPITAL_COMMUNITY)
Admission: RE | Admit: 2015-08-07 | Discharge: 2015-08-07 | Disposition: A | Discharge status: Home / Self Care | Payer: BLUE CROSS/BLUE SHIELD | Source: Ambulatory Visit | Attending: Gastroenterology | Admitting: Gastroenterology

## 2015-08-07 DIAGNOSIS — K5090 Crohn's disease, unspecified, without complications: Secondary | ICD-10-CM | POA: Diagnosis present

## 2015-08-07 MED ORDER — INFLIXIMAB 100 MG IV SOLR
5.0000 mg/kg | Freq: Once | INTRAVENOUS | Status: AC
  Administered 2015-08-07: 400 mg via INTRAVENOUS
  Filled 2015-08-07: qty 40

## 2015-08-07 MED ORDER — SODIUM CHLORIDE 0.9 % IV SOLN
INTRAVENOUS | Status: DC
  Administered 2015-08-07: 09:00:00 via INTRAVENOUS

## 2015-10-15 ENCOUNTER — Other Ambulatory Visit (HOSPITAL_COMMUNITY): Payer: Self-pay | Admitting: *Deleted

## 2015-10-16 ENCOUNTER — Encounter (HOSPITAL_COMMUNITY)
Admission: RE | Admit: 2015-10-16 | Discharge: 2015-10-16 | Disposition: A | Discharge status: Home / Self Care | Payer: BLUE CROSS/BLUE SHIELD | Source: Ambulatory Visit | Attending: Gastroenterology | Admitting: Gastroenterology

## 2015-10-16 DIAGNOSIS — K5090 Crohn's disease, unspecified, without complications: Secondary | ICD-10-CM | POA: Insufficient documentation

## 2015-10-16 MED ORDER — SODIUM CHLORIDE 0.9 % IV SOLN
INTRAVENOUS | Status: DC
  Administered 2015-10-16: 09:00:00 via INTRAVENOUS

## 2015-10-16 MED ORDER — SODIUM CHLORIDE 0.9 % IV SOLN
5.0000 mg/kg | Freq: Once | INTRAVENOUS | Status: AC
  Administered 2015-10-16: 400 mg via INTRAVENOUS
  Filled 2015-10-16: qty 40

## 2015-12-25 ENCOUNTER — Ambulatory Visit (HOSPITAL_COMMUNITY)
Admission: RE | Admit: 2015-12-25 | Discharge: 2015-12-25 | Disposition: A | Discharge status: Home / Self Care | Payer: BLUE CROSS/BLUE SHIELD | Source: Ambulatory Visit | Attending: Gastroenterology | Admitting: Gastroenterology

## 2015-12-25 DIAGNOSIS — K5090 Crohn's disease, unspecified, without complications: Secondary | ICD-10-CM | POA: Insufficient documentation

## 2015-12-25 MED ORDER — SODIUM CHLORIDE 0.9 % IV SOLN
5.0000 mg/kg | Freq: Once | INTRAVENOUS | Status: AC
  Administered 2015-12-25: 400 mg via INTRAVENOUS
  Filled 2015-12-25: qty 20

## 2015-12-25 MED ORDER — SODIUM CHLORIDE 0.9 % IV SOLN
Freq: Once | INTRAVENOUS | Status: AC
  Administered 2015-12-25: 08:00:00 via INTRAVENOUS

## 2016-03-02 ENCOUNTER — Encounter (HOSPITAL_COMMUNITY)
Admission: RE | Admit: 2016-03-02 | Discharge: 2016-03-02 | Disposition: A | Discharge status: Home / Self Care | Payer: BLUE CROSS/BLUE SHIELD | Source: Ambulatory Visit | Attending: Gastroenterology | Admitting: Gastroenterology

## 2016-03-02 DIAGNOSIS — K5090 Crohn's disease, unspecified, without complications: Secondary | ICD-10-CM | POA: Diagnosis not present

## 2016-03-02 MED ORDER — SODIUM CHLORIDE 0.9 % IV SOLN
5.0000 mg/kg | Freq: Once | INTRAVENOUS | Status: AC
  Administered 2016-03-02: 400 mg via INTRAVENOUS
  Filled 2016-03-02: qty 40

## 2016-03-02 MED ORDER — SODIUM CHLORIDE 0.9 % IV SOLN
Freq: Once | INTRAVENOUS | Status: AC
  Administered 2016-03-02: 09:00:00 via INTRAVENOUS

## 2016-05-11 ENCOUNTER — Inpatient Hospital Stay (HOSPITAL_COMMUNITY): Admission: RE | Admit: 2016-05-11 | Payer: BLUE CROSS/BLUE SHIELD | Source: Ambulatory Visit

## 2016-05-13 ENCOUNTER — Other Ambulatory Visit (HOSPITAL_COMMUNITY): Payer: Self-pay | Admitting: *Deleted

## 2016-05-16 ENCOUNTER — Encounter (HOSPITAL_COMMUNITY)
Admission: RE | Admit: 2016-05-16 | Discharge: 2016-05-16 | Disposition: A | Discharge status: Home / Self Care | Payer: BLUE CROSS/BLUE SHIELD | Source: Ambulatory Visit | Attending: Gastroenterology | Admitting: Gastroenterology

## 2016-05-16 DIAGNOSIS — K5090 Crohn's disease, unspecified, without complications: Secondary | ICD-10-CM | POA: Insufficient documentation

## 2016-05-16 MED ORDER — SODIUM CHLORIDE 0.9 % IV SOLN
Freq: Once | INTRAVENOUS | Status: DC
  Administered 2016-05-16: 09:00:00 via INTRAVENOUS

## 2016-05-16 MED ORDER — SODIUM CHLORIDE 0.9 % IV SOLN
5.0000 mg/kg | Freq: Once | INTRAVENOUS | Status: DC
  Administered 2016-05-16: 09:00:00 400 mg via INTRAVENOUS
  Filled 2016-05-16: qty 40

## 2016-07-25 ENCOUNTER — Encounter (HOSPITAL_COMMUNITY)
Admission: RE | Admit: 2016-07-25 | Discharge: 2016-07-25 | Disposition: A | Discharge status: Home / Self Care | Payer: BLUE CROSS/BLUE SHIELD | Source: Ambulatory Visit | Attending: Gastroenterology | Admitting: Gastroenterology

## 2016-07-25 DIAGNOSIS — K5090 Crohn's disease, unspecified, without complications: Secondary | ICD-10-CM | POA: Diagnosis not present

## 2016-07-25 MED ORDER — SODIUM CHLORIDE 0.9 % IV SOLN
5.0000 mg/kg | Freq: Once | INTRAVENOUS | Status: AC
  Administered 2016-07-25: 08:00:00 400 mg via INTRAVENOUS
  Filled 2016-07-25: qty 40

## 2016-07-25 MED ORDER — SODIUM CHLORIDE 0.9 % IV SOLN
Freq: Once | INTRAVENOUS | Status: AC
  Administered 2016-07-25: 08:00:00 via INTRAVENOUS

## 2016-10-03 ENCOUNTER — Other Ambulatory Visit (HOSPITAL_COMMUNITY): Payer: Self-pay | Admitting: *Deleted

## 2016-10-04 ENCOUNTER — Encounter (HOSPITAL_COMMUNITY)
Admission: RE | Admit: 2016-10-04 | Discharge: 2016-10-04 | Disposition: A | Discharge status: Home / Self Care | Payer: BLUE CROSS/BLUE SHIELD | Source: Ambulatory Visit | Attending: Gastroenterology | Admitting: Gastroenterology

## 2016-10-04 DIAGNOSIS — K5090 Crohn's disease, unspecified, without complications: Secondary | ICD-10-CM | POA: Insufficient documentation

## 2016-10-04 MED ORDER — SODIUM CHLORIDE 0.9 % IV SOLN
Freq: Once | INTRAVENOUS | Status: AC
  Administered 2016-10-04: 09:00:00 via INTRAVENOUS

## 2016-10-04 MED ORDER — SODIUM CHLORIDE 0.9 % IV SOLN
5.0000 mg/kg | Freq: Once | INTRAVENOUS | Status: AC
  Administered 2016-10-04: 09:00:00 400 mg via INTRAVENOUS
  Filled 2016-10-04: qty 40

## 2021-10-29 ENCOUNTER — Other Ambulatory Visit: Payer: Self-pay | Admitting: Gastroenterology

## 2021-10-29 DIAGNOSIS — R7989 Other specified abnormal findings of blood chemistry: Secondary | ICD-10-CM

## 2021-11-12 ENCOUNTER — Ambulatory Visit
Admission: RE | Admit: 2021-11-12 | Discharge: 2021-11-12 | Disposition: A | Discharge status: Home / Self Care | Payer: 59 | Source: Ambulatory Visit | Attending: Gastroenterology | Admitting: Gastroenterology

## 2021-11-12 DIAGNOSIS — R7989 Other specified abnormal findings of blood chemistry: Secondary | ICD-10-CM

## 2021-12-01 ENCOUNTER — Emergency Department (HOSPITAL_COMMUNITY)
Admission: EM | Admit: 2021-12-01 | Discharge: 2021-12-01 | Disposition: A | Discharge status: Home / Self Care | Payer: 59 | Attending: Emergency Medicine | Admitting: Emergency Medicine

## 2021-12-01 ENCOUNTER — Encounter (HOSPITAL_COMMUNITY): Payer: Self-pay | Admitting: Emergency Medicine

## 2021-12-01 ENCOUNTER — Emergency Department (HOSPITAL_COMMUNITY): Payer: 59

## 2021-12-01 ENCOUNTER — Other Ambulatory Visit: Payer: Self-pay

## 2021-12-01 DIAGNOSIS — N132 Hydronephrosis with renal and ureteral calculous obstruction: Secondary | ICD-10-CM | POA: Insufficient documentation

## 2021-12-01 DIAGNOSIS — R109 Unspecified abdominal pain: Secondary | ICD-10-CM | POA: Diagnosis present

## 2021-12-01 LAB — COMPREHENSIVE METABOLIC PANEL
ALT: 112 U/L — ABNORMAL HIGH (ref 0–44)
AST: 53 U/L — ABNORMAL HIGH (ref 15–41)
Albumin: 4.4 g/dL (ref 3.5–5.0)
Alkaline Phosphatase: 52 U/L (ref 38–126)
Anion gap: 11 (ref 5–15)
BUN: 17 mg/dL (ref 6–20)
CO2: 23 mmol/L (ref 22–32)
Calcium: 9.5 mg/dL (ref 8.9–10.3)
Chloride: 107 mmol/L (ref 98–111)
Creatinine, Ser: 1.12 mg/dL (ref 0.61–1.24)
GFR, Estimated: >60 mL/min (ref >60)
Glucose, Bld: 150 mg/dL — ABNORMAL HIGH (ref 70–99)
Potassium: 3.5 mmol/L (ref 3.5–5.1)
Sodium: 141 mmol/L (ref 135–145)
Total Bilirubin: 0.2 mg/dL — ABNORMAL LOW (ref 0.3–1.2)
Total Protein: 7.7 g/dL (ref 6.5–8.1)

## 2021-12-01 LAB — URINALYSIS, ROUTINE W REFLEX MICROSCOPIC
Bilirubin Urine: NEGATIVE
Glucose, UA: NEGATIVE mg/dL
Ketones, ur: NEGATIVE mg/dL
Leukocytes,Ua: NEGATIVE
Nitrite: NEGATIVE
Protein, ur: 30 mg/dL — AB
RBC / HPF: >50 RBC/hpf — ABNORMAL HIGH (ref 0–5)
Specific Gravity, Urine: 1.028 (ref 1.005–1.030)
pH: 5 (ref 5.0–8.0)

## 2021-12-01 LAB — CBC WITH DIFFERENTIAL/PLATELET
Abs Immature Granulocytes: 0.05 10*3/uL (ref 0.00–0.07)
Basophils Absolute: 0 10*3/uL (ref 0.0–0.1)
Basophils Relative: 0 %
Eosinophils Absolute: 0.2 10*3/uL (ref 0.0–0.5)
Eosinophils Relative: 1 %
HCT: 39.1 % (ref 39.0–52.0)
Hemoglobin: 13.5 g/dL (ref 13.0–17.0)
Immature Granulocytes: 0 %
Lymphocytes Relative: 31 %
Lymphs Abs: 3.6 10*3/uL (ref 0.7–4.0)
MCH: 28.2 pg (ref 26.0–34.0)
MCHC: 34.5 g/dL (ref 30.0–36.0)
MCV: 81.6 fL (ref 80.0–100.0)
Monocytes Absolute: 0.8 10*3/uL (ref 0.1–1.0)
Monocytes Relative: 7 %
Neutro Abs: 7 10*3/uL (ref 1.7–7.7)
Neutrophils Relative %: 61 %
Platelets: 306 10*3/uL (ref 150–400)
RBC: 4.79 MIL/uL (ref 4.22–5.81)
RDW: 12.5 % (ref 11.5–15.5)
WBC: 11.6 10*3/uL — ABNORMAL HIGH (ref 4.0–10.5)
nRBC: 0 % (ref 0.0–0.2)

## 2021-12-01 MED ORDER — HYDROMORPHONE HCL 1 MG/ML IJ SOLN
0.5000 mg | Freq: Once | INTRAMUSCULAR | Status: AC
  Administered 2021-12-01: 0.5 mg via INTRAVENOUS
  Filled 2021-12-01: qty 1

## 2021-12-01 MED ORDER — ONDANSETRON 4 MG PO TBDP: 4.0000 mg | ORAL_TABLET | Freq: Three times a day (TID) | ORAL | 0 refills | Status: AC | PRN

## 2021-12-01 MED ORDER — KETOROLAC TROMETHAMINE 15 MG/ML IJ SOLN
15.0000 mg | Freq: Once | INTRAMUSCULAR | Status: AC
  Administered 2021-12-01: 15 mg via INTRAVENOUS
  Filled 2021-12-01: qty 1

## 2021-12-01 MED ORDER — OXYCODONE-ACETAMINOPHEN 5-325 MG PO TABS: 1.0000 | ORAL_TABLET | Freq: Three times a day (TID) | ORAL | 0 refills | Status: AC | PRN

## 2021-12-01 MED ORDER — TAMSULOSIN HCL 0.4 MG PO CAPS: 0.4000 mg | ORAL_CAPSULE | Freq: Every day | ORAL | 0 refills | Status: AC

## 2021-12-01 MED ORDER — ONDANSETRON 4 MG PO TBDP
4.0000 mg | ORAL_TABLET | Freq: Once | ORAL | Status: AC
  Administered 2021-12-01: 4 mg via ORAL
  Filled 2021-12-01: qty 1

## 2021-12-01 MED ORDER — OXYCODONE-ACETAMINOPHEN 5-325 MG PO TABS
1.0000 | ORAL_TABLET | Freq: Once | ORAL | Status: AC
  Administered 2021-12-01: 1 via ORAL
  Filled 2021-12-01: qty 1

## 2021-12-01 NOTE — ED Triage Notes (Signed)
Pt reports lower right abd pain that moves from his side to the front.  Pt reports n/v, unable to sit still due to pain.

## 2021-12-01 NOTE — ED Provider Notes (Signed)
[facility] EMERGENCY DEPARTMENT Provider Note   CSN: [id number] Arrival date & time: 12/01/21  8280     History  Chief Complaint  Patient presents with   Abdominal Pain    Brian Goodman is a 36 y.o. male.   Abdominal Pain Patient on the right side abdominal pain.  Began about 2 in the morning.  Right flank going to abdomen.  Somewhat waxing and waning.  Has had some nausea and vomiting.  No fever.  No dysuria.  No history of kidney stones.  Does have a history of Crohn's disease.     Home Medications Prior to Admission medications   Medication Sig Start Date End Date Taking? Authorizing Provider  ondansetron (ZOFRAN-ODT) 4 MG disintegrating tablet Take 1 tablet (4 mg total) by mouth every 8 (eight) hours as needed for nausea or vomiting. 12/01/21  Yes Benjiman Core, MD  oxyCODONE-acetaminophen (PERCOCET/ROXICET) 5-325 MG tablet Take 1-2 tablets by mouth every 8 (eight) hours as needed for severe pain. 12/01/21  Yes Benjiman Core, MD  tamsulosin (FLOMAX) 0.4 MG CAPS capsule Take 1 capsule (0.4 mg total) by mouth daily. 12/01/21  Yes Benjiman Core, MD  ferrous sulfate 325 (65 FE) MG tablet Take 325 mg by mouth daily with breakfast. Pt takes on a prn basis     [provider]  predniSONE (DELTASONE) 20 MG tablet Take 20 mg by mouth 2 (two) times daily. Pt takes on an prn basis     [provider]      Allergies    Patient has no known allergies.    Review of Systems   Review of Systems  Gastrointestinal:  Positive for abdominal pain.    Physical Exam Updated Vital Signs BP (!) 147/99 (BP Location: Right Arm)   Pulse 78   Temp 97.7 F (36.5 C) (Oral)   Resp 18   Ht 5\' 9"  (1.753 m)   Wt 86.2 kg   SpO2 100%   BMI 28.06 kg/m  Physical Exam Vitals and nursing note reviewed.  Constitutional:      Comments: Patient appears uncomfortable.  Cardiovascular:     Rate and Rhythm: Normal rate and regular rhythm.  Pulmonary:      Breath sounds: Normal breath sounds.  Abdominal:     Hernia: No hernia is present.  Genitourinary:    Comments: Some CVA tenderness on the right. Skin:    General: Skin is warm.     Capillary Refill: Capillary refill takes less than 2 seconds.  Neurological:     Mental Status: He is alert and oriented to person, place, and time.     ED Results / Procedures / Treatments   Labs (all labs ordered are listed, but only abnormal results are displayed) Labs Reviewed  COMPREHENSIVE METABOLIC PANEL - Abnormal; Notable for the following components:      Result Value   Glucose, Bld 150 (*)    AST 53 (*)    ALT 112 (*)    Total Bilirubin 0.2 (*)    All other components within normal limits  CBC WITH DIFFERENTIAL/PLATELET - Abnormal; Notable for the following components:   WBC 11.6 (*)    All other components within normal limits  URINALYSIS, ROUTINE W REFLEX MICROSCOPIC - Abnormal; Notable for the following components:   Color, Urine AMBER (*)    APPearance TURBID (*)    Hgb urine dipstick LARGE (*)    Protein, ur 30 (*)    RBC / HPF >50 (*)  Bacteria, UA FEW (*)    All other components within normal limits    EKG None  Radiology CT Renal Stone Study  Result Date: 12/01/2021 CLINICAL DATA:  36 year old male with history of right-sided flank pain. Suspected kidney stone. EXAM: CT ABDOMEN AND PELVIS WITHOUT CONTRAST TECHNIQUE: Multidetector CT imaging of the abdomen and pelvis was performed following the standard protocol without IV contrast. RADIATION DOSE REDUCTION: This exam was performed according to the departmental dose-optimization program which includes automated exposure control, adjustment of the mA and/or kV according to patient size and/or use of iterative reconstruction technique. COMPARISON:  CT of the abdomen and pelvis 01/14/2009. FINDINGS: Lower chest: Unremarkable. Hepatobiliary: Diffuse low attenuation throughout the hepatic parenchyma, indicative of a  background of hepatic steatosis. No definite suspicious cystic or solid hepatic lesions are confidently identified on today's noncontrast CT examination. Unenhanced appearance of the gallbladder is normal. Pancreas: No definite pancreatic mass or peripancreatic fluid collections or inflammatory changes are noted on today's noncontrast CT examination. Spleen: Unremarkable. Adrenals/Urinary Tract: At the junction of proximal and middle third of the right ureter (coronal image 56 of series 6) there is a 4 mm calculus which is associated with mild proximal right hydroureteronephrosis. No additional calculi are noted elsewhere within the collecting system of either kidney, along the course of either ureter, or within the lumen of the urinary bladder. No left hydroureteronephrosis. Unenhanced appearance of the left kidney, bilateral adrenal glands and urinary bladder is otherwise normal. Stomach/Bowel: Unenhanced appearance of the stomach is normal. There is no pathologic dilatation of small bowel or colon. Normal appendix. Vascular/Lymphatic: No atherosclerotic calcifications are noted in the abdominal aorta or pelvic vasculature. No lymphadenopathy noted in the abdomen or pelvis. Reproductive: Prostate gland and seminal vesicles are unremarkable in appearance. Other: No significant volume of ascites.  No pneumoperitoneum. Musculoskeletal: There are no aggressive appearing lytic or blastic lesions noted in the visualized portions of the skeleton. IMPRESSION: 1. 4 mm calculus at the junction of proximal and middle third of the right ureter with mild proximal right hydroureteronephrosis indicating mild right-sided obstruction at this time. 2. Hepatic steatosis. Electronically Signed   By: Trudie Reed M.D.   On: 12/01/2021 05:45    Procedures Procedures    Medications Ordered in ED Medications  ondansetron (ZOFRAN-ODT) disintegrating tablet 4 mg (4 mg Oral Given 12/01/21 0432)  oxyCODONE-acetaminophen  (PERCOCET/ROXICET) 5-325 MG per tablet 1 tablet (1 tablet Oral Given 12/01/21 0433)  ketorolac (TORADOL) 15 MG/ML injection 15 mg (15 mg Intravenous Given 12/01/21 0853)  HYDROmorphone (DILAUDID) injection 0.5 mg (0.5 mg Intravenous Given 12/01/21 1324)    ED Course/ Medical Decision Making/ A&P                           Medical Decision Making Risk Prescription drug management.   Patient presents with right flank pain.  Acute.  Differential diagnosis includes infection, retroperitoneal hematoma, kidney stone, bowel obstruction.  White count is mildly elevated.  CT scan independently interpreted and shows right-sided rather high 4 mm kidney stone.  It is obstructing.  Urine shows rare bacteria but otherwise does not appear to have infection.  Doubt infected obstructed stone.  Had been given Zofran and oxycodone through triage.  No pain relief.  We will now add some Toradol and Dilaudid.  Hopefully should be able to get pain under control and discharge home.  Patient feels much better after Toradol and Dilaudid.  Appears stable for discharge home.  Will treat with pain medicine and antiemetics and will give Flomax since it is relatively high and large stone.  Urology follow-up as needed.  Instructions given on return precautions such as fever         Final Clinical Impression(s) / ED Diagnoses Final diagnoses:  Ureteral stone with hydronephrosis    Rx / DC Orders ED Discharge Orders          Ordered    oxyCODONE-acetaminophen (PERCOCET/ROXICET) 5-325 MG tablet  Every 8 hours PRN        12/01/21 0930    ondansetron (ZOFRAN-ODT) 4 MG disintegrating tablet  Every 8 hours PRN        12/01/21 0930    tamsulosin (FLOMAX) 0.4 MG CAPS capsule  Daily        12/01/21 0930              Benjiman Core, MD 12/01/21 [phone]

## 2021-12-01 NOTE — ED Notes (Signed)
Patient states pain is worse and previous pain medicine didn't help pain. Triage RN notified of this complaint

## 2021-12-01 NOTE — ED Provider Triage Note (Signed)
Emergency Medicine Provider Triage Evaluation Note  Brian Goodman , a 36 y.o. male  was evaluated in triage.  Pt complains of right flank pain x 2AM. Pain to right flank, radiates into abdomen, waxing/waning, no alleviating/aggravating factors, cannot get comfortable. Associated n/v. Brian Goodman fever, dysuria, hematuria, testicular pain/swelling..  Review of Systems  Per above.   Physical Exam  BP (!) 150/90 (BP Location: Right Arm)   Pulse 71   Temp (!) 97.5 F (36.4 C) (Oral)   Resp 19   Ht 5\' 9"  (1.753 m)   Wt 86.2 kg   SpO2 100%   BMI 28.06 kg/m  Gen:   Awake, no distress   Resp:  Normal effort  MSK:   Moves extremities without difficulty  Other:  Abdomen without peritoneal signs.   Medical Decision Making  Medically screening exam initiated at 4:32 AM.  Appropriate orders placed.  Brian Goodman was informed that the remainder of the evaluation will be completed by another provider, this initial triage assessment does not replace that evaluation, and the importance of remaining in the ED until their evaluation is complete.   Labs & CT renal stone study ordered.  Flank pain.    Brian Goodman, Brian Goodman 12/01/21 [phone]
